# Patient Record
Sex: Female | Born: 1955 | Race: White | Hispanic: No | Marital: Married | Smoking: Former smoker
Health system: Southern US, Community
[De-identification: ages and names within clinical notes are randomized; demographics above are authoritative.]

## PROBLEM LIST (undated history)

## (undated) DIAGNOSIS — T7840XA Allergy, unspecified, initial encounter: Secondary | ICD-10-CM

## (undated) DIAGNOSIS — E079 Disorder of thyroid, unspecified: Secondary | ICD-10-CM

## (undated) DIAGNOSIS — E559 Vitamin D deficiency, unspecified: Secondary | ICD-10-CM

## (undated) DIAGNOSIS — F32A Depression, unspecified: Secondary | ICD-10-CM

## (undated) DIAGNOSIS — I10 Essential (primary) hypertension: Secondary | ICD-10-CM

## (undated) DIAGNOSIS — F419 Anxiety disorder, unspecified: Secondary | ICD-10-CM

## (undated) DIAGNOSIS — F329 Major depressive disorder, single episode, unspecified: Secondary | ICD-10-CM

## (undated) DIAGNOSIS — J45909 Unspecified asthma, uncomplicated: Secondary | ICD-10-CM

## (undated) HISTORY — DX: Essential (primary) hypertension: I10

## (undated) HISTORY — DX: Allergy, unspecified, initial encounter: T78.40XA

## (undated) HISTORY — DX: Major depressive disorder, single episode, unspecified: F32.9

## (undated) HISTORY — PX: SIGMOIDOSCOPY: SUR1295

## (undated) HISTORY — PX: TOTAL HIP ARTHROPLASTY: SHX124

## (undated) HISTORY — PX: BACK SURGERY: SHX140

## (undated) HISTORY — PX: CHOLECYSTECTOMY: SHX55

## (undated) HISTORY — PX: CATARACT EXTRACTION W/ INTRAOCULAR LENS IMPLANT: SHX1309

## (undated) HISTORY — DX: Anxiety disorder, unspecified: F41.9

## (undated) HISTORY — PX: TUBAL LIGATION: SHX77

## (undated) HISTORY — DX: Depression, unspecified: F32.A

## (undated) HISTORY — DX: Vitamin D deficiency, unspecified: E55.9

## (undated) HISTORY — DX: Unspecified asthma, uncomplicated: J45.909

## (undated) HISTORY — PX: HEMORRHOID SURGERY: SHX153

---

## 1997-08-14 ENCOUNTER — Ambulatory Visit (HOSPITAL_COMMUNITY): Admission: RE | Admit: 1997-08-14 | Discharge: 1997-08-14 | Payer: Self-pay | Admitting: Internal Medicine

## 1997-12-11 ENCOUNTER — Ambulatory Visit: Admission: RE | Admit: 1997-12-11 | Discharge: 1997-12-11 | Payer: Self-pay | Admitting: Internal Medicine

## 1998-05-28 ENCOUNTER — Ambulatory Visit (HOSPITAL_COMMUNITY): Admission: RE | Admit: 1998-05-28 | Discharge: 1998-05-28 | Payer: Self-pay | Admitting: Gastroenterology

## 1998-07-08 ENCOUNTER — Ambulatory Visit (HOSPITAL_COMMUNITY): Admission: RE | Admit: 1998-07-08 | Discharge: 1998-07-08 | Payer: Self-pay | Admitting: Gastroenterology

## 1999-07-15 ENCOUNTER — Ambulatory Visit (HOSPITAL_COMMUNITY): Admission: RE | Admit: 1999-07-15 | Discharge: 1999-07-15 | Payer: Self-pay | Admitting: Internal Medicine

## 1999-07-15 ENCOUNTER — Encounter: Payer: Self-pay | Admitting: Internal Medicine

## 1999-09-10 ENCOUNTER — Other Ambulatory Visit: Admission: RE | Admit: 1999-09-10 | Discharge: 1999-09-10 | Payer: Self-pay | Admitting: Obstetrics and Gynecology

## 2000-08-12 ENCOUNTER — Ambulatory Visit (HOSPITAL_COMMUNITY): Admission: RE | Admit: 2000-08-12 | Discharge: 2000-08-12 | Payer: Self-pay | Admitting: Obstetrics and Gynecology

## 2000-08-12 ENCOUNTER — Encounter: Payer: Self-pay | Admitting: Obstetrics and Gynecology

## 2000-11-08 ENCOUNTER — Encounter: Payer: Self-pay | Admitting: Internal Medicine

## 2000-11-08 ENCOUNTER — Ambulatory Visit (HOSPITAL_COMMUNITY): Admission: RE | Admit: 2000-11-08 | Discharge: 2000-11-08 | Payer: Self-pay | Admitting: Internal Medicine

## 2001-04-04 ENCOUNTER — Ambulatory Visit (HOSPITAL_COMMUNITY): Admission: RE | Admit: 2001-04-04 | Discharge: 2001-04-04 | Payer: Self-pay | Admitting: Gastroenterology

## 2003-02-21 ENCOUNTER — Ambulatory Visit (HOSPITAL_COMMUNITY): Admission: RE | Admit: 2003-02-21 | Discharge: 2003-02-21 | Payer: Self-pay | Admitting: Obstetrics and Gynecology

## 2003-02-21 ENCOUNTER — Encounter: Payer: Self-pay | Admitting: Obstetrics and Gynecology

## 2003-03-31 ENCOUNTER — Emergency Department (HOSPITAL_COMMUNITY): Admission: EM | Admit: 2003-03-31 | Discharge: 2003-03-31 | Payer: Self-pay | Admitting: Emergency Medicine

## 2004-07-28 ENCOUNTER — Ambulatory Visit: Payer: Self-pay | Admitting: *Deleted

## 2004-08-24 ENCOUNTER — Other Ambulatory Visit: Admission: RE | Admit: 2004-08-24 | Discharge: 2004-08-24 | Payer: Self-pay | Admitting: Obstetrics and Gynecology

## 2004-11-06 ENCOUNTER — Ambulatory Visit: Payer: Self-pay | Admitting: Pulmonary Disease

## 2004-11-18 ENCOUNTER — Ambulatory Visit: Payer: Self-pay

## 2010-09-25 ENCOUNTER — Encounter: Payer: Self-pay | Admitting: Gastroenterology

## 2010-09-25 ENCOUNTER — Ambulatory Visit (AMBULATORY_SURGERY_CENTER): Payer: BC Managed Care – PPO | Admitting: *Deleted

## 2010-09-25 VITALS — Ht 59.5 in | Wt 132.0 lb

## 2010-09-25 DIAGNOSIS — Z1211 Encounter for screening for malignant neoplasm of colon: Secondary | ICD-10-CM

## 2010-09-25 MED ORDER — PEG-KCL-NACL-NASULF-NA ASC-C 100 G PO SOLR: ORAL | Status: DC

## 2010-10-16 ENCOUNTER — Encounter: Payer: Self-pay | Admitting: Gastroenterology

## 2010-10-16 ENCOUNTER — Ambulatory Visit (AMBULATORY_SURGERY_CENTER): Payer: BC Managed Care – PPO | Admitting: Gastroenterology

## 2010-10-16 VITALS — BP 132/91 | HR 75 | Temp 98.0°F | Resp 20 | Ht 59.5 in | Wt 132.0 lb

## 2010-10-16 DIAGNOSIS — K648 Other hemorrhoids: Secondary | ICD-10-CM

## 2010-10-16 DIAGNOSIS — Z1211 Encounter for screening for malignant neoplasm of colon: Secondary | ICD-10-CM

## 2010-10-16 HISTORY — PX: COLONOSCOPY: SHX174

## 2010-10-16 MED ORDER — SODIUM CHLORIDE 0.9 % IV SOLN: 500.0000 mL | INTRAVENOUS | Status: DC

## 2010-10-16 NOTE — Patient Instructions (Signed)
Please refer to blue and green discharge instruction sheets. 

## 2010-10-19 ENCOUNTER — Telehealth: Payer: Self-pay

## 2010-10-19 NOTE — Telephone Encounter (Signed)
No answer/no ID anwswering machine.

## 2010-10-19 NOTE — Telephone Encounter (Signed)
No ID on answering machine. 

## 2011-11-19 ENCOUNTER — Ambulatory Visit (HOSPITAL_COMMUNITY)
Admission: RE | Admit: 2011-11-19 | Discharge: 2011-11-19 | Disposition: A | Discharge status: Home / Self Care | Payer: BC Managed Care – PPO | Source: Ambulatory Visit | Attending: Internal Medicine | Admitting: Internal Medicine

## 2011-11-19 ENCOUNTER — Other Ambulatory Visit (HOSPITAL_COMMUNITY): Payer: Self-pay | Admitting: Internal Medicine

## 2011-11-19 DIAGNOSIS — I10 Essential (primary) hypertension: Secondary | ICD-10-CM

## 2011-11-19 DIAGNOSIS — F17200 Nicotine dependence, unspecified, uncomplicated: Secondary | ICD-10-CM

## 2011-11-19 DIAGNOSIS — G510 Bell's palsy: Secondary | ICD-10-CM | POA: Insufficient documentation

## 2011-12-01 ENCOUNTER — Other Ambulatory Visit: Payer: Self-pay | Admitting: Internal Medicine

## 2011-12-01 DIAGNOSIS — G510 Bell's palsy: Secondary | ICD-10-CM

## 2011-12-01 DIAGNOSIS — R519 Headache, unspecified: Secondary | ICD-10-CM

## 2011-12-01 DIAGNOSIS — G8929 Other chronic pain: Secondary | ICD-10-CM

## 2011-12-03 ENCOUNTER — Ambulatory Visit
Admission: RE | Admit: 2011-12-03 | Discharge: 2011-12-03 | Disposition: A | Discharge status: Home / Self Care | Payer: BC Managed Care – PPO | Source: Ambulatory Visit | Attending: Internal Medicine | Admitting: Internal Medicine

## 2011-12-03 DIAGNOSIS — G510 Bell's palsy: Secondary | ICD-10-CM

## 2011-12-03 DIAGNOSIS — R519 Headache, unspecified: Secondary | ICD-10-CM

## 2011-12-03 DIAGNOSIS — G8929 Other chronic pain: Secondary | ICD-10-CM

## 2011-12-03 MED ORDER — GADOBENATE DIMEGLUMINE 529 MG/ML IV SOLN
12.0000 mL | Freq: Once | INTRAVENOUS | Status: AC | PRN
  Administered 2011-12-03: 12 mL via INTRAVENOUS

## 2013-05-29 DIAGNOSIS — T7840XA Allergy, unspecified, initial encounter: Secondary | ICD-10-CM | POA: Insufficient documentation

## 2013-05-29 DIAGNOSIS — F419 Anxiety disorder, unspecified: Secondary | ICD-10-CM | POA: Insufficient documentation

## 2013-05-29 DIAGNOSIS — F329 Major depressive disorder, single episode, unspecified: Secondary | ICD-10-CM | POA: Insufficient documentation

## 2013-05-29 DIAGNOSIS — F32A Depression, unspecified: Secondary | ICD-10-CM | POA: Insufficient documentation

## 2013-05-29 DIAGNOSIS — I10 Essential (primary) hypertension: Secondary | ICD-10-CM | POA: Insufficient documentation

## 2013-05-30 ENCOUNTER — Encounter: Payer: Self-pay | Admitting: Internal Medicine

## 2013-05-30 ENCOUNTER — Ambulatory Visit (INDEPENDENT_AMBULATORY_CARE_PROVIDER_SITE_OTHER): Payer: BC Managed Care – PPO | Admitting: Internal Medicine

## 2013-05-30 VITALS — BP 126/88 | HR 76 | Temp 97.9°F | Resp 16 | Wt 142.4 lb

## 2013-05-30 DIAGNOSIS — E559 Vitamin D deficiency, unspecified: Secondary | ICD-10-CM | POA: Insufficient documentation

## 2013-05-30 DIAGNOSIS — I10 Essential (primary) hypertension: Secondary | ICD-10-CM

## 2013-05-30 DIAGNOSIS — E782 Mixed hyperlipidemia: Secondary | ICD-10-CM | POA: Insufficient documentation

## 2013-05-30 DIAGNOSIS — R7309 Other abnormal glucose: Secondary | ICD-10-CM | POA: Insufficient documentation

## 2013-05-30 DIAGNOSIS — Z79899 Other long term (current) drug therapy: Secondary | ICD-10-CM

## 2013-05-30 LAB — HEMOGLOBIN A1C
HEMOGLOBIN A1C: 5.2 % (ref <5.7)
MEAN PLASMA GLUCOSE: 103 mg/dL (ref <117)

## 2013-05-30 MED ORDER — MELOXICAM 15 MG PO TABS: 15.0000 mg | ORAL_TABLET | Freq: Every day | ORAL | Status: DC

## 2013-05-30 NOTE — Progress Notes (Signed)
Patient ID: Shirley Johnson, female   DOB: March 11, 1956, 58 y.o.   MRN: [id number]   This very nice 58 y.o. MWF presents for 3 month follow up with Hypertension, Hyperlipidemia, Pre-Diabetes and Vitamin D Deficiency.    HTN predates since 2007. She quit smoking about 4 months ago when she realized the effect of nicotine on raising her BP. BP has been controlled at home. Today's BP: 126/88 mmHg . Patient denies any cardiac type chest pain, palpitations, dyspnea/orthopnea/PND, dizziness, claudication, or dependent edema.   Hyperlipidemia and elevated Triglycerides is controlled with diet. Last Cholesterol was  152, Triglycerides were 124, HDL47  and LDL 80 in [id number]. Patient denies myalgias or other med SE's.    Also, the patient is screened for PreDiabetes/insulin resistance with last A1c of 5.1% in July 2014. Patient denies any symptoms of reactive hypoglycemia, diabetic polys, paresthesias or visual blurring.   She is seeing Dr Ellene Route for limiting neck and back pain and is using Anaprox and/or Lodine for stiffness and pain . She says robaxin helps a lot with the stiffness. She has no radiculitis.   Further, Patient has history of Vitamin D Deficiency with last vitamin D of 78 in July. Patient supplements vitamin D without any suspected side-effects.    Medication List       This list is accurate as of: 05/30/13  9:33 AM.  Always use your most recent med list.               acetaminophen 500 MG tablet  Commonly known as:  TYLENOL  Take 1,000 mg by mouth every 6 (six) hours as needed.     ALPRAZolam 1 MG tablet  Commonly known as:  XANAX  Take 1 mg by mouth at bedtime.     etodolac 200 MG capsule  Commonly known as:  LODINE  Take 200 mg by mouth 2 (two) times daily. Takes prn for back pain     MAXZIDE-25 37.5-25 MG per tablet  Generic drug:  triamterene-hydrochlorothiazide  Take 1 tablet by mouth daily.     meloxicam 15 MG tablet  Commonly known as:  MOBIC  Take 1 tablet (15 mg  total) by mouth daily. Wit food for pain & inflammation     methocarbamol 500 MG tablet  Commonly known as:  ROBAXIN  Take 500 mg by mouth 2 (two) times daily.     naproxen sodium 220 MG tablet  Commonly known as:  ANAPROX  Take 440 mg by mouth as needed. pain     SENOKOT PO  Take by mouth. Takes 2 at bedtime     sertraline 50 MG tablet  Commonly known as:  ZOLOFT  Take 50 mg by mouth at bedtime.     Vitamin D 2000 UNITS tablet  Take 2,000 Units by mouth daily. Takes 4000 units daily         Allergies  Allergen Reactions  . Doxycycline Nausea And Vomiting  . Sulfa Antibiotics Nausea And Vomiting  . Vicodin [Hydrocodone-Acetaminophen] Other (See Comments)    Horrible nightmares  . Erythromycin Rash  . Penicillins Rash  . Tetracyclines & Related Rash    PMHx:   Past Medical History  Diagnosis Date  . Allergy     pollen  . Depression   . Hypertension   . Vitamin D deficiency   . Anxiety     FHx:    Reviewed / unchanged  SHx:    Reviewed / unchanged  Systems Review: Constitutional: Denies fever,  chills, wt changes, headaches, insomnia, fatigue, night sweats, change in appetite. Eyes: Denies redness, blurred vision, diplopia, discharge, itchy, watery eyes.  ENT: Denies discharge, congestion, post nasal drip, epistaxis, sore throat, earache, hearing loss, dental pain, tinnitus, vertigo, sinus pain, snoring.  CV: Denies chest pain, palpitations, irregular heartbeat, syncope, dyspnea, diaphoresis, orthopnea, PND, claudication, edema. Respiratory: denies cough, dyspnea, DOE, pleurisy, hoarseness, laryngitis, wheezing.  Gastrointestinal: Denies dysphagia, odynophagia, heartburn, reflux, water brash, abdominal pain or cramps, nausea, vomiting, bloating, diarrhea, constipation, hematemesis, melena, hematochezia,  or hemorrhoids. Genitourinary: Denies dysuria, frequency, urgency, nocturia, hesitancy, discharge, hematuria, flank pain. Musculoskeletal: Has neck/back pains as  above Skin: Denies pruritus, rash, hives, warts, acne, eczema, change in skin lesion(s). Neuro: No weakness, tremor, incoordination, spasms, paresthesia, or pain. Psychiatric: Denies confusion, memory loss, or sensory loss. Endo: Denies change in weight, skin, hair change.  Heme/Lymph: No excessive bleeding, bruising, orenlarged lymph nodes.  Filed Vitals:   05/30/13 0902  BP: 126/88  Pulse: 76  Temp: 97.9 F (36.6 C)  Resp: 16    Estimated body mass index is 28.29 kg/(m^2) as calculated from the following:   Height as of 10/16/10: 4' 11.5" (1.511 m).   Weight as of this encounter: 142 lb 6.4 oz (64.592 kg).  On Exam: Appears well nourished - in no distress. Eyes: PERRLA, EOMs, conjunctiva no swelling or erythema. Sinuses: No frontal/maxillary tenderness ENT/Mouth: EAC's clear, TM's nl w/o erythema, bulging. Nares clear w/o erythema, swelling, exudates. Oropharynx clear without erythema or exudates. Oral hygiene is good. Tongue normal, non obstructing. Hearing intact.  Neck: Supple. Thyroid nl. Car 2+/2+ without bruits, nodes or JVD. Chest: Respirations nl with BS clear & equal w/o rales, rhonchi, wheezing or stridor.  Cor: Heart sounds normal w/ regular rate and rhythm without sig. murmurs, gallops, clicks, or rubs. Peripheral pulses normal and equal  without edema.  Abdomen: Soft & bowel sounds normal. Non-tender w/o guarding, rebound, hernias, masses, or organomegaly.  Lymphatics: Unremarkable.  Musculoskeletal: Full ROM all peripheral extremities, joint stability, 5/5 strength, and normal gait.  Skin: Warm, dry without exposed rashes, lesions, ecchymosis apparent.  Neuro: Cranial nerves intact, reflexes equal bilaterally. Sensory-motor testing grossly intact. Tendon reflexes grossly intact.  Pysch: Alert & oriented x 3. Insight and judgement nl & appropriate. No ideations.  Assessment and Plan:  1. Hypertension - Continue monitor blood pressure at home. Continue diet/meds  same.  2. Hyperlipidemia - Continue diet, exercise,& lifestyle modifications. Continue monitor periodic cholesterol/liver & renal functions   3. Pre-diabetes - Continue screening & diet, exercise, lifestyle modifications. Monitor appropriate labs.  4. Vitamin D Deficiency - Continue supplementation.  5. Chronic Neck/Low Back Pain due to DJD/DDD - discussed trying Meloxicam in lieu of anaprox/lodine  Recommended regular exercise, BP monitoring, weight control, and discussed med and SE's. Recommended labs to assess and monitor clinical status. Further disposition pending results of labs.

## 2013-05-30 NOTE — Patient Instructions (Signed)

## 2013-05-31 LAB — MAGNESIUM: Magnesium: 1.7 mg/dL (ref 1.5–2.5)

## 2013-05-31 LAB — HEPATIC FUNCTION PANEL
ALBUMIN: 4.8 g/dL (ref 3.5–5.2)
ALK PHOS: 53 U/L (ref 39–117)
ALT: 18 U/L (ref 0–35)
AST: 20 U/L (ref 0–37)
BILIRUBIN DIRECT: 0.2 mg/dL (ref 0.0–0.3)
BILIRUBIN INDIRECT: 0.5 mg/dL (ref 0.0–0.9)
BILIRUBIN TOTAL: 0.7 mg/dL (ref 0.3–1.2)
Total Protein: 6.9 g/dL (ref 6.0–8.3)

## 2013-05-31 LAB — LIPID PANEL
CHOLESTEROL: 166 mg/dL (ref 0–200)
HDL: 42 mg/dL (ref >39)
LDL Cholesterol: 96 mg/dL (ref 0–99)
Total CHOL/HDL Ratio: 4 Ratio
Triglycerides: 138 mg/dL (ref <150)
VLDL: 28 mg/dL (ref 0–40)

## 2013-05-31 LAB — BASIC METABOLIC PANEL WITH GFR
BUN: 18 mg/dL (ref 6–23)
CHLORIDE: 100 meq/L (ref 96–112)
CO2: 32 mEq/L (ref 19–32)
CREATININE: 0.72 mg/dL (ref 0.50–1.10)
Calcium: 10.2 mg/dL (ref 8.4–10.5)
GFR, Est African American: >89 mL/min
Glucose, Bld: 93 mg/dL (ref 70–99)
POTASSIUM: 3.5 meq/L (ref 3.5–5.3)
SODIUM: 140 meq/L (ref 135–145)

## 2013-05-31 LAB — INSULIN, FASTING: INSULIN FASTING, SERUM: 11 u[IU]/mL (ref 3–28)

## 2013-05-31 LAB — CBC WITH DIFFERENTIAL/PLATELET
BASOS ABS: 0 10*3/uL (ref 0.0–0.1)
Basophils Relative: 1 % (ref 0–1)
Eosinophils Absolute: 0.2 10*3/uL (ref 0.0–0.7)
Eosinophils Relative: 3 % (ref 0–5)
HCT: 44.1 % (ref 36.0–46.0)
Hemoglobin: 15 g/dL (ref 12.0–15.0)
LYMPHS ABS: 1.9 10*3/uL (ref 0.7–4.0)
LYMPHS PCT: 33 % (ref 12–46)
MCH: 30.3 pg (ref 26.0–34.0)
MCHC: 34 g/dL (ref 30.0–36.0)
MCV: 89.1 fL (ref 78.0–100.0)
Monocytes Absolute: 0.4 10*3/uL (ref 0.1–1.0)
Monocytes Relative: 6 % (ref 3–12)
NEUTROS ABS: 3.4 10*3/uL (ref 1.7–7.7)
NEUTROS PCT: 57 % (ref 43–77)
PLATELETS: 251 10*3/uL (ref 150–400)
RBC: 4.95 MIL/uL (ref 3.87–5.11)
RDW: 12.9 % (ref 11.5–15.5)
WBC: 5.9 10*3/uL (ref 4.0–10.5)

## 2013-05-31 LAB — VITAMIN D 25 HYDROXY (VIT D DEFICIENCY, FRACTURES): Vit D, 25-Hydroxy: 94 ng/mL — ABNORMAL HIGH (ref 30–89)

## 2013-05-31 LAB — TSH: TSH: 3.331 u[IU]/mL (ref 0.350–4.500)

## 2013-08-14 ENCOUNTER — Other Ambulatory Visit: Payer: Self-pay | Admitting: Internal Medicine

## 2013-08-14 ENCOUNTER — Other Ambulatory Visit: Payer: Self-pay | Admitting: *Deleted

## 2013-08-14 MED ORDER — PREDNISONE 10 MG PO TABS: ORAL_TABLET | ORAL | Status: DC

## 2013-08-14 NOTE — Telephone Encounter (Signed)
Patient called and asked for med forn poison ivy rash.  RX Prednisone called to [facility] [address] per Dr Melford Aase.

## 2013-08-27 ENCOUNTER — Other Ambulatory Visit: Payer: Self-pay | Admitting: Internal Medicine

## 2013-09-07 ENCOUNTER — Encounter: Payer: Self-pay | Admitting: Physician Assistant

## 2013-09-07 ENCOUNTER — Ambulatory Visit (INDEPENDENT_AMBULATORY_CARE_PROVIDER_SITE_OTHER): Payer: BC Managed Care – PPO | Admitting: Physician Assistant

## 2013-09-07 VITALS — BP 132/80 | HR 76 | Temp 97.9°F | Resp 16 | Wt 146.0 lb

## 2013-09-07 DIAGNOSIS — E559 Vitamin D deficiency, unspecified: Secondary | ICD-10-CM

## 2013-09-07 DIAGNOSIS — R7309 Other abnormal glucose: Secondary | ICD-10-CM

## 2013-09-07 DIAGNOSIS — I10 Essential (primary) hypertension: Secondary | ICD-10-CM

## 2013-09-07 DIAGNOSIS — Z79899 Other long term (current) drug therapy: Secondary | ICD-10-CM

## 2013-09-07 DIAGNOSIS — E782 Mixed hyperlipidemia: Secondary | ICD-10-CM

## 2013-09-07 LAB — LIPID PANEL
Cholesterol: 161 mg/dL (ref 0–200)
HDL: 38 mg/dL — ABNORMAL LOW (ref >39)
LDL CALC: 88 mg/dL (ref 0–99)
Total CHOL/HDL Ratio: 4.2 Ratio
Triglycerides: 174 mg/dL — ABNORMAL HIGH (ref <150)
VLDL: 35 mg/dL (ref 0–40)

## 2013-09-07 LAB — BASIC METABOLIC PANEL WITH GFR
BUN: 21 mg/dL (ref 6–23)
CO2: 31 meq/L (ref 19–32)
Calcium: 10.3 mg/dL (ref 8.4–10.5)
Chloride: 101 mEq/L (ref 96–112)
Creat: 0.7 mg/dL (ref 0.50–1.10)
GFR, Est African American: >89 mL/min
GFR, Est Non African American: >89 mL/min
GLUCOSE: 89 mg/dL (ref 70–99)
POTASSIUM: 3.4 meq/L — AB (ref 3.5–5.3)
SODIUM: 140 meq/L (ref 135–145)

## 2013-09-07 LAB — CBC WITH DIFFERENTIAL/PLATELET
Basophils Absolute: 0.1 10*3/uL (ref 0.0–0.1)
Basophils Relative: 1 % (ref 0–1)
Eosinophils Absolute: 0.3 10*3/uL (ref 0.0–0.7)
Eosinophils Relative: 5 % (ref 0–5)
HCT: 40.7 % (ref 36.0–46.0)
HEMOGLOBIN: 14.1 g/dL (ref 12.0–15.0)
LYMPHS ABS: 1.7 10*3/uL (ref 0.7–4.0)
Lymphocytes Relative: 30 % (ref 12–46)
MCH: 29 pg (ref 26.0–34.0)
MCHC: 34.6 g/dL (ref 30.0–36.0)
MCV: 83.6 fL (ref 78.0–100.0)
MONOS PCT: 7 % (ref 3–12)
Monocytes Absolute: 0.4 10*3/uL (ref 0.1–1.0)
NEUTROS ABS: 3.2 10*3/uL (ref 1.7–7.7)
NEUTROS PCT: 57 % (ref 43–77)
Platelets: 250 10*3/uL (ref 150–400)
RBC: 4.87 MIL/uL (ref 3.87–5.11)
RDW: 13.8 % (ref 11.5–15.5)
WBC: 5.7 10*3/uL (ref 4.0–10.5)

## 2013-09-07 LAB — HEPATIC FUNCTION PANEL
ALT: 32 U/L (ref 0–35)
AST: 29 U/L (ref 0–37)
Albumin: 4.7 g/dL (ref 3.5–5.2)
Alkaline Phosphatase: 61 U/L (ref 39–117)
BILIRUBIN DIRECT: 0.1 mg/dL (ref 0.0–0.3)
BILIRUBIN INDIRECT: 0.4 mg/dL (ref 0.2–1.2)
Total Bilirubin: 0.5 mg/dL (ref 0.2–1.2)
Total Protein: 7.1 g/dL (ref 6.0–8.3)

## 2013-09-07 LAB — MAGNESIUM: MAGNESIUM: 1.7 mg/dL (ref 1.5–2.5)

## 2013-09-07 LAB — TSH: TSH: 5.403 u[IU]/mL — ABNORMAL HIGH (ref 0.350–4.500)

## 2013-09-07 NOTE — Progress Notes (Signed)
HPI 58 y.o. female  presents for 3 month follow up with hypertension, hyperlipidemia, prediabetes and vitamin D. Her blood pressure has been controlled at home, today their BP is BP: 132/80 mmHg She does not workout. She denies chest pain, shortness of breath, dizziness.  She is not on cholesterol medication and denies myalgias. Her cholesterol is at goal. The cholesterol last visit was:   Lab Results  Component Value Date   CHOL 166 05/30/2013   HDL 42 05/30/2013   LDLCALC 96 05/30/2013   TRIG 138 05/30/2013   CHOLHDL 4.0 05/30/2013   Last A1C in the office was:  Lab Results  Component Value Date   HGBA1C 5.2 05/30/2013   Patient is on Vitamin D supplement.   She is have hysterectomy, rectocele repair and bladder tack on July 7th at [facility] hospital with Dr. Tressia Danas in hopes of trying to relieve some of her back spasms.  She recently had a medroldose pak for poison ivy which is resolved.  Her allergies have been very bad, she went to urgent care, took antihistamines and nose sprays. She started taking OTC bovine thyroid and states it is better.   Current Medications:  Current Outpatient Prescriptions on File Prior to Visit  Medication Sig Dispense Refill  . acetaminophen (TYLENOL) 500 MG tablet Take 1,000 mg by mouth every 6 (six) hours as needed.        . Cholecalciferol (VITAMIN D) 2000 UNITS tablet Take 2,000 Units by mouth daily. Takes 4000 units daily      . etodolac (LODINE) 200 MG capsule Take 200 mg by mouth 2 (two) times daily. Takes prn for back pain      . meloxicam (MOBIC) 15 MG tablet Take 1 tablet (15 mg total) by mouth daily. Wit food for pain & inflammation  90 tablet  99  . methocarbamol (ROBAXIN) 500 MG tablet Take 500 mg by mouth 2 (two) times daily.      . naproxen sodium (ANAPROX) 220 MG tablet Take 440 mg by mouth as needed. pain       . Sennosides (SENOKOT PO) Take by mouth. Takes 2 at bedtime      . sertraline (ZOLOFT) 50 MG tablet Take 50 mg by mouth at bedtime.         . triamterene-hydrochlorothiazide (MAXZIDE) 75-50 MG per tablet TAKE 1 TABLET EVERY MORNING FOR BP AND FLUID  90 tablet  1   Current Facility-Administered Medications on File Prior to Visit  Medication Dose Route Frequency Provider Last Rate Last Dose  . 0.9 %  sodium chloride infusion  500 mL Intravenous Continuous Inda Castle, MD        Medical History:  Past Medical History  Diagnosis Date  . Allergy     pollen  . Depression   . Hypertension   . Vitamin D deficiency   . Anxiety    Allergies:  Allergies  Allergen Reactions  . Doxycycline Nausea And Vomiting  . Sulfa Antibiotics Nausea And Vomiting  . Vicodin [Hydrocodone-Acetaminophen] Other (See Comments)    Horrible nightmares  . Erythromycin Rash  . Penicillins Rash  . Tetracyclines & Related Rash     Review of Systems: [X]  = complains of  [ ]  = denies  General: Fatigue [ ]  Fever [ ]  Chills [ ]  Weakness [ ]   Insomnia [ ]  Eyes: Redness [ ]  Blurred vision [ ]  Diplopia [ ]   ENT: Congestion [ ]  Sinus Pain [ ]  Post Nasal Drip [ ]  Sore  Throat [ ]  Earache [ ]   Cardiac: Chest pain/pressure [ ]  SOB [ ]  Orthopnea [ ]   Palpitations [ ]   Paroxysmal nocturnal dyspnea[ ]  Claudication [ ]  Edema [ ]   Pulmonary: Cough [ ]  Wheezing[ ]   SOB [ ]   Snoring [ ]   GI: Nausea [ ]  Vomiting[ ]  Dysphagia[ ]  Heartburn[ ]  Abdominal pain [ ]  Constipation [ ] ; Diarrhea [ ] ; BRBPR [ ]  Melena[ ]  GU: Hematuria[ ]  Dysuria [ ]  Nocturia[ ]  Urgency [ ]   Hesitancy [ ]  Discharge [ ]  Neuro: Headaches[ ]  Vertigo[ ]  Paresthesias[ ]  Spasm [ ]  Speech changes [ ]  Incoordination [ ]   Ortho: Arthritis [ ]  Joint pain [ ]  Muscle pain [ ]  Joint swelling [ ]  Back Pain Valu.Nieves ] Skin:  Rash [ ]   Pruritis [ ]  Change in skin lesion [ ]   Psych: Depression[ ]  Anxiety[ ]  Confusion [ ]  Memory loss [ ]   Heme/Lypmh: Bleeding [ ]  Bruising [ ]  Enlarged lymph nodes [ ]   Endocrine: Visual blurring [ ]  Paresthesia [ ]  Polyuria [ ]  Polydypsea [ ]    Heat/cold intolerance [ ]   Hypoglycemia [ ]   Family history- Review and unchanged Social history- Review and unchanged Physical Exam: BP 132/80  Pulse 76  Temp(Src) 97.9 F (36.6 C)  Resp 16  Wt 146 lb (66.225 kg)  LMP 09/25/2010 Wt Readings from Last 3 Encounters:  09/07/13 146 lb (66.225 kg)  05/30/13 142 lb 6.4 oz (64.592 kg)  10/16/10 132 lb (59.875 kg)   General Appearance: Well nourished, in no apparent distress. Eyes: PERRLA, EOMs, conjunctiva no swelling or erythema Sinuses: No Frontal/maxillary tenderness ENT/Mouth: Ext aud canals clear, TMs without erythema, bulging. No erythema, swelling, or exudate on post pharynx.  Tonsils not swollen or erythematous. Hearing normal.  Neck: Supple, thyroid normal.  Respiratory: Respiratory effort normal, BS equal bilaterally without rales, rhonchi, wheezing or stridor.  Cardio: RRR with no MRGs. Brisk peripheral pulses without edema.  Abdomen: Soft, + BS.  Non tender, no guarding, rebound, hernias, masses. Lymphatics: Non tender without lymphadenopathy.  Musculoskeletal: Full ROM, 5/5 strength, normal gait.  Skin: Warm, dry without rashes, lesions, ecchymosis.  Neuro: Cranial nerves intact. Normal muscle tone, no cerebellar symptoms. Sensation intact.  Psych: Awake and oriented X 3, normal affect, Insight and Judgment appropriate.   Assessment and Plan:  Hypertension: Continue medication, monitor blood pressure at home. Continue DASH diet. Cholesterol: Continue diet and exercise. Check cholesterol.  Vitamin D Def- check level and continue medications.   Continue diet and meds as discussed. Further disposition pending results of labs.  Vicie Mutters 8:37 AM

## 2013-09-07 NOTE — Patient Instructions (Signed)

## 2013-09-08 LAB — VITAMIN D 25 HYDROXY (VIT D DEFICIENCY, FRACTURES): VIT D 25 HYDROXY: 84 ng/mL (ref 30–89)

## 2013-09-10 ENCOUNTER — Other Ambulatory Visit: Payer: Self-pay

## 2013-09-10 MED ORDER — POTASSIUM CHLORIDE CRYS ER 20 MEQ PO TBCR: 20.0000 meq | EXTENDED_RELEASE_TABLET | Freq: Once | ORAL | Status: DC

## 2013-09-10 MED ORDER — LEVOTHYROXINE SODIUM 75 MCG PO TABS: 75.0000 ug | ORAL_TABLET | Freq: Every day | ORAL | Status: DC

## 2013-09-11 ENCOUNTER — Other Ambulatory Visit: Payer: Self-pay | Admitting: Physician Assistant

## 2013-09-11 DIAGNOSIS — E041 Nontoxic single thyroid nodule: Secondary | ICD-10-CM

## 2013-09-11 DIAGNOSIS — R490 Dysphonia: Secondary | ICD-10-CM

## 2013-09-11 DIAGNOSIS — R1310 Dysphagia, unspecified: Secondary | ICD-10-CM

## 2013-09-14 ENCOUNTER — Ambulatory Visit
Admission: RE | Admit: 2013-09-14 | Discharge: 2013-09-14 | Disposition: A | Discharge status: Home / Self Care | Payer: BC Managed Care – PPO | Source: Ambulatory Visit | Attending: Physician Assistant | Admitting: Physician Assistant

## 2013-09-14 DIAGNOSIS — E041 Nontoxic single thyroid nodule: Secondary | ICD-10-CM

## 2013-10-09 ENCOUNTER — Other Ambulatory Visit: Payer: Self-pay | Admitting: Internal Medicine

## 2013-10-16 ENCOUNTER — Other Ambulatory Visit (INDEPENDENT_AMBULATORY_CARE_PROVIDER_SITE_OTHER): Payer: BC Managed Care – PPO

## 2013-10-16 DIAGNOSIS — E876 Hypokalemia: Secondary | ICD-10-CM

## 2013-10-16 DIAGNOSIS — E039 Hypothyroidism, unspecified: Secondary | ICD-10-CM

## 2013-10-16 LAB — BASIC METABOLIC PANEL WITH GFR
BUN: 23 mg/dL (ref 6–23)
CALCIUM: 9.8 mg/dL (ref 8.4–10.5)
CO2: 27 meq/L (ref 19–32)
CREATININE: 0.7 mg/dL (ref 0.50–1.10)
Chloride: 102 mEq/L (ref 96–112)
GFR, Est African American: >89 mL/min
Glucose, Bld: 91 mg/dL (ref 70–99)
Potassium: 3.7 mEq/L (ref 3.5–5.3)
Sodium: 139 mEq/L (ref 135–145)

## 2013-10-16 LAB — TSH: TSH: 0.529 u[IU]/mL (ref 0.350–4.500)

## 2013-10-24 ENCOUNTER — Other Ambulatory Visit: Payer: Self-pay

## 2013-10-24 NOTE — Telephone Encounter (Signed)
Received a paper note from front office staff, patient having problems accessing mychart, return call to patient , lmom with results and instructions regarding 10-16-13 labs

## 2013-10-31 ENCOUNTER — Encounter (HOSPITAL_COMMUNITY): Payer: Self-pay | Admitting: Pharmacist

## 2013-11-02 ENCOUNTER — Telehealth: Payer: Self-pay

## 2013-11-02 ENCOUNTER — Encounter (HOSPITAL_COMMUNITY)
Admission: RE | Admit: 2013-11-02 | Discharge: 2013-11-02 | Disposition: A | Discharge status: Home / Self Care | Payer: BC Managed Care – PPO | Source: Ambulatory Visit | Attending: Obstetrics and Gynecology | Admitting: Obstetrics and Gynecology

## 2013-11-02 ENCOUNTER — Encounter (HOSPITAL_COMMUNITY): Payer: Self-pay

## 2013-11-02 DIAGNOSIS — Z01812 Encounter for preprocedural laboratory examination: Secondary | ICD-10-CM | POA: Insufficient documentation

## 2013-11-02 DIAGNOSIS — Z01818 Encounter for other preprocedural examination: Secondary | ICD-10-CM | POA: Insufficient documentation

## 2013-11-02 LAB — BASIC METABOLIC PANEL
BUN: 21 mg/dL (ref 6–23)
CHLORIDE: 100 meq/L (ref 96–112)
CO2: 27 mEq/L (ref 19–32)
Calcium: 10 mg/dL (ref 8.4–10.5)
Creatinine, Ser: 0.66 mg/dL (ref 0.50–1.10)
GFR calc Af Amer: >90 mL/min (ref >90)
GLUCOSE: 105 mg/dL — AB (ref 70–99)
POTASSIUM: 3.3 meq/L — AB (ref 3.7–5.3)
SODIUM: 140 meq/L (ref 137–147)

## 2013-11-02 LAB — CBC
HCT: 40.8 % (ref 36.0–46.0)
HEMOGLOBIN: 14.5 g/dL (ref 12.0–15.0)
MCH: 30 pg (ref 26.0–34.0)
MCHC: 35.5 g/dL (ref 30.0–36.0)
MCV: 84.3 fL (ref 78.0–100.0)
Platelets: 266 10*3/uL (ref 150–400)
RBC: 4.84 MIL/uL (ref 3.87–5.11)
RDW: 12.9 % (ref 11.5–15.5)
WBC: 7.3 10*3/uL (ref 4.0–10.5)

## 2013-11-02 NOTE — Telephone Encounter (Signed)
Hassan Rowan from pre-op called requesting patient's most recent EKG. EKG was faxed to [facility] [phone]

## 2013-11-02 NOTE — Patient Instructions (Signed)
20 LLANA DESHAZO  11/02/2013   Your procedure is scheduled on:  11/13/13  Enter through the Main Entrance of [facility] [place] up the phone at the desk and dial [phone].   Call this number if you have problems the morning of surgery: [phone]   Remember:   Do not eat food:After Midnight.  Do not drink clear liquids: After Midnight.  Take these medicines the morning of surgery with A SIP OF WATER: Robaxin, Maxide and K-Dur. May take Thyroid Very early    Do not wear jewelry, make-up or nail polish.  Do not wear lotions, powders, or perfumes. You may wear deodorant.  Do not shave 48 hours prior to surgery.  Do not bring valuables to the hospital.  [facility] is not   responsible for any belongings or valuables brought to the hospital.  Contacts, dentures or bridgework may not be worn into surgery.  Leave suitcase in the car. After surgery it may be brought to your room.  For patients admitted to the hospital, checkout time is 11:00 AM the day of              discharge.   Patients discharged the day of surgery will not be allowed to drive             home.  Name and phone number of your driver: NA  Special Instructions:      Please read over the following fact sheets that you were given:   Surgical Site Infection Prevention

## 2013-11-11 NOTE — H&P (Signed)
Shirley Johnson is an 58 y.o. G 2 P 1 presents with symptomatic pelvic relaxation.  She feels a constant bulge in her vagina.  It is especially worse at the end of the day.  She is not having any stress urinary incontinence.  She is having problems with chronic constipation.    Pertinent Gynecological History: Menses: post-menopausal Bleeding: post menopausal bleeding Contraception: none DES exposure: denies Blood transfusions: none Sexually transmitted diseases: no past history Previous GYN Procedures: none  Last mammogram: normal Date: 2015 Last pap: normal Date: 2015  OB History: G2, P1  Menstrual History: Menarche age: unknown Patient's last menstrual period was 09/25/2010.    Past Medical History  Diagnosis Date  . Allergy     pollen  . Depression   . Hypertension   . Vitamin D deficiency   . Anxiety     Past Surgical History  Procedure Laterality Date  . Back surgery    . Tubal ligation    . Sigmoidoscopy    . Hemorrhoid surgery      banding    Family History  Problem Relation Age of Onset  . Hypertension Mother   . COPD Mother   . Alzheimer's disease Mother   . Heart disease Father   . Cancer Father     Prostate  . Thyroid disease Father     Social History:  reports that she quit smoking about 9 months ago. Her smoking use included Cigarettes. She smoked 0.80 packs per day. She has never used smokeless tobacco. She reports that she does not drink alcohol or use illicit drugs.  Allergies:  Allergies  Allergen Reactions  . Doxycycline Nausea And Vomiting  . Sulfa Antibiotics Nausea And Vomiting  . Vicodin [Hydrocodone-Acetaminophen] Other (See Comments)    Horrible nightmares  . Erythromycin Rash  . Penicillins Rash    Childhood rxn  . Tetracyclines & Related Rash    No prescriptions prior to admission    Review of Systems  All other systems reviewed and are negative.   Last menstrual period 09/25/2010. Physical Exam  Nursing note and  vitals reviewed. Constitutional: She appears well-developed.  HENT:  Head: Normocephalic.  Eyes: Pupils are equal, round, and reactive to light.  Neck: Normal range of motion.  Cardiovascular: Normal rate and regular rhythm.   Respiratory: Effort normal.  GI: Soft.  Genitourinary:  Grade 2 to 3 cervical uterine prolapse Shirlee Limerick 2 - 3 rectocele Minimal movement of the anterior vaginal wall No leakage of urine with cough    No results found for this or any previous visit (from the past 24 hour(s)).  No results found.  Assessment/Plan: Symptomatic Pelvic Relaxation LAVH BSO  Possible anterior repair Possible Possible repair Possible sacrospinous ligament fixation of vaginal cuff Risks reviewed with patient  Consent signed  Shirley Johnson L 11/11/2013, 10:12 PM

## 2013-11-12 MED ORDER — CIPROFLOXACIN IN D5W 400 MG/200ML IV SOLN
400.0000 mg | INTRAVENOUS | Status: AC
  Administered 2013-11-13: 400 mg via INTRAVENOUS
  Filled 2013-11-12: qty 200

## 2013-11-12 MED ORDER — METRONIDAZOLE IN NACL 5-0.79 MG/ML-% IV SOLN
500.0000 mg | INTRAVENOUS | Status: AC
  Administered 2013-11-13: 500 mg via INTRAVENOUS
  Filled 2013-11-12: qty 100

## 2013-11-13 ENCOUNTER — Encounter (HOSPITAL_COMMUNITY)
Admission: RE | Disposition: A | Discharge status: Home / Self Care | Payer: Self-pay | Source: Ambulatory Visit | Attending: Obstetrics and Gynecology

## 2013-11-13 ENCOUNTER — Observation Stay (HOSPITAL_COMMUNITY)
Admission: RE | Admit: 2013-11-13 | Discharge: 2013-11-14 | Disposition: A | Discharge status: Home / Self Care | Payer: BC Managed Care – PPO | Source: Ambulatory Visit | Attending: Obstetrics and Gynecology | Admitting: Obstetrics and Gynecology

## 2013-11-13 ENCOUNTER — Ambulatory Visit (HOSPITAL_COMMUNITY): Payer: BC Managed Care – PPO | Admitting: Anesthesiology

## 2013-11-13 ENCOUNTER — Encounter (HOSPITAL_COMMUNITY): Payer: BC Managed Care – PPO | Admitting: Anesthesiology

## 2013-11-13 ENCOUNTER — Encounter (HOSPITAL_COMMUNITY): Payer: Self-pay | Admitting: Anesthesiology

## 2013-11-13 DIAGNOSIS — D252 Subserosal leiomyoma of uterus: Secondary | ICD-10-CM | POA: Insufficient documentation

## 2013-11-13 DIAGNOSIS — E559 Vitamin D deficiency, unspecified: Secondary | ICD-10-CM | POA: Insufficient documentation

## 2013-11-13 DIAGNOSIS — K5900 Constipation, unspecified: Secondary | ICD-10-CM | POA: Insufficient documentation

## 2013-11-13 DIAGNOSIS — I10 Essential (primary) hypertension: Secondary | ICD-10-CM | POA: Insufficient documentation

## 2013-11-13 DIAGNOSIS — N80109 Endometriosis of ovary, unspecified side, unspecified depth: Secondary | ICD-10-CM | POA: Insufficient documentation

## 2013-11-13 DIAGNOSIS — Z90710 Acquired absence of both cervix and uterus: Secondary | ICD-10-CM | POA: Diagnosis present

## 2013-11-13 DIAGNOSIS — D251 Intramural leiomyoma of uterus: Secondary | ICD-10-CM | POA: Insufficient documentation

## 2013-11-13 DIAGNOSIS — F3289 Other specified depressive episodes: Secondary | ICD-10-CM | POA: Insufficient documentation

## 2013-11-13 DIAGNOSIS — F329 Major depressive disorder, single episode, unspecified: Secondary | ICD-10-CM | POA: Insufficient documentation

## 2013-11-13 DIAGNOSIS — E039 Hypothyroidism, unspecified: Secondary | ICD-10-CM | POA: Insufficient documentation

## 2013-11-13 DIAGNOSIS — N838 Other noninflammatory disorders of ovary, fallopian tube and broad ligament: Secondary | ICD-10-CM | POA: Insufficient documentation

## 2013-11-13 DIAGNOSIS — N812 Incomplete uterovaginal prolapse: Principal | ICD-10-CM | POA: Insufficient documentation

## 2013-11-13 DIAGNOSIS — N801 Endometriosis of ovary: Secondary | ICD-10-CM | POA: Insufficient documentation

## 2013-11-13 DIAGNOSIS — Z8249 Family history of ischemic heart disease and other diseases of the circulatory system: Secondary | ICD-10-CM | POA: Insufficient documentation

## 2013-11-13 DIAGNOSIS — Z87891 Personal history of nicotine dependence: Secondary | ICD-10-CM | POA: Insufficient documentation

## 2013-11-13 HISTORY — PX: LAPAROSCOPIC ASSISTED VAGINAL HYSTERECTOMY: SHX5398

## 2013-11-13 HISTORY — PX: ANTERIOR AND POSTERIOR REPAIR: SHX5121

## 2013-11-13 SURGERY — HYSTERECTOMY, VAGINAL, LAPAROSCOPY-ASSISTED
Anesthesia: General | Site: Vagina

## 2013-11-13 MED ORDER — DEXTROSE IN LACTATED RINGERS 5 % IV SOLN
INTRAVENOUS | Status: DC
  Administered 2013-11-13 (×2): via INTRAVENOUS

## 2013-11-13 MED ORDER — SODIUM CHLORIDE 0.9 % IJ SOLN: 9.0000 mL | INTRAMUSCULAR | Status: DC | PRN

## 2013-11-13 MED ORDER — ONDANSETRON HCL 4 MG/2ML IJ SOLN
4.0000 mg | Freq: Four times a day (QID) | INTRAMUSCULAR | Status: DC | PRN
  Administered 2013-11-13 – 2013-11-14 (×2): 4 mg via INTRAVENOUS
  Filled 2013-11-13 (×2): qty 2

## 2013-11-13 MED ORDER — ROCURONIUM BROMIDE 100 MG/10ML IV SOLN
INTRAVENOUS | Status: AC
  Filled 2013-11-13: qty 1

## 2013-11-13 MED ORDER — LIDOCAINE HCL (CARDIAC) 20 MG/ML IV SOLN
INTRAVENOUS | Status: AC
  Filled 2013-11-13: qty 5

## 2013-11-13 MED ORDER — ESTRADIOL 0.1 MG/GM VA CREA
TOPICAL_CREAM | VAGINAL | Status: AC
  Filled 2013-11-13: qty 42.5

## 2013-11-13 MED ORDER — NALOXONE HCL 0.4 MG/ML IJ SOLN: 0.4000 mg | INTRAMUSCULAR | Status: DC | PRN

## 2013-11-13 MED ORDER — PHENYLEPHRINE 40 MCG/ML (10ML) SYRINGE FOR IV PUSH (FOR BLOOD PRESSURE SUPPORT)
PREFILLED_SYRINGE | INTRAVENOUS | Status: AC
  Filled 2013-11-13: qty 5

## 2013-11-13 MED ORDER — LACTATED RINGERS IV SOLN
INTRAVENOUS | Status: DC
  Administered 2013-11-13 (×4): via INTRAVENOUS

## 2013-11-13 MED ORDER — BUPIVACAINE-EPINEPHRINE 0.5% -1:200000 IJ SOLN
INTRAMUSCULAR | Status: DC | PRN
  Administered 2013-11-13: 30 mL

## 2013-11-13 MED ORDER — EPHEDRINE SULFATE 50 MG/ML IJ SOLN
INTRAMUSCULAR | Status: DC | PRN
  Administered 2013-11-13: 10 mg via INTRAVENOUS
  Administered 2013-11-13: 5 mg via INTRAVENOUS

## 2013-11-13 MED ORDER — MENTHOL 3 MG MT LOZG: 1.0000 | LOZENGE | OROMUCOSAL | Status: DC | PRN

## 2013-11-13 MED ORDER — ROCURONIUM BROMIDE 100 MG/10ML IV SOLN
INTRAVENOUS | Status: DC | PRN
  Administered 2013-11-13: 40 mg via INTRAVENOUS

## 2013-11-13 MED ORDER — METOCLOPRAMIDE HCL 5 MG/ML IJ SOLN: 10.0000 mg | Freq: Once | INTRAMUSCULAR | Status: DC | PRN

## 2013-11-13 MED ORDER — BUPIVACAINE-EPINEPHRINE (PF) 0.5% -1:200000 IJ SOLN
INTRAMUSCULAR | Status: AC
  Filled 2013-11-13: qty 30

## 2013-11-13 MED ORDER — LEVOTHYROXINE SODIUM 75 MCG PO TABS: 75.0000 ug | ORAL_TABLET | Freq: Every day | ORAL | Status: DC

## 2013-11-13 MED ORDER — NEOSTIGMINE METHYLSULFATE 10 MG/10ML IV SOLN
INTRAVENOUS | Status: DC | PRN
  Administered 2013-11-13: 3 mg via INTRAVENOUS

## 2013-11-13 MED ORDER — DIPHENHYDRAMINE HCL 12.5 MG/5ML PO ELIX: 12.5000 mg | ORAL_SOLUTION | Freq: Four times a day (QID) | ORAL | Status: DC | PRN

## 2013-11-13 MED ORDER — HYDROMORPHONE 0.3 MG/ML IV SOLN
INTRAVENOUS | Status: DC
  Administered 2013-11-13: 12:00:00 via INTRAVENOUS
  Administered 2013-11-13: 0.6 mg via INTRAVENOUS
  Administered 2013-11-13: 0.399 mg via INTRAVENOUS
  Administered 2013-11-13: 0.2 mg via INTRAVENOUS
  Filled 2013-11-13: qty 25

## 2013-11-13 MED ORDER — GLYCOPYRROLATE 0.2 MG/ML IJ SOLN
INTRAMUSCULAR | Status: DC | PRN
  Administered 2013-11-13: .6 mg via INTRAVENOUS

## 2013-11-13 MED ORDER — KETOROLAC TROMETHAMINE 30 MG/ML IJ SOLN
INTRAMUSCULAR | Status: AC
  Filled 2013-11-13: qty 1

## 2013-11-13 MED ORDER — TRAMADOL HCL 50 MG PO TABS
50.0000 mg | ORAL_TABLET | Freq: Four times a day (QID) | ORAL | Status: DC | PRN
  Administered 2013-11-13: 50 mg via ORAL
  Filled 2013-11-13 (×2): qty 1

## 2013-11-13 MED ORDER — EPHEDRINE SULFATE 50 MG/ML IJ SOLN
INTRAMUSCULAR | Status: AC
  Filled 2013-11-13: qty 1

## 2013-11-13 MED ORDER — DIPHENHYDRAMINE HCL 50 MG/ML IJ SOLN: 12.5000 mg | Freq: Four times a day (QID) | INTRAMUSCULAR | Status: DC | PRN

## 2013-11-13 MED ORDER — HYDROMORPHONE HCL PF 1 MG/ML IJ SOLN
INTRAMUSCULAR | Status: DC | PRN
  Administered 2013-11-13 (×2): 0.5 mg via INTRAVENOUS

## 2013-11-13 MED ORDER — HYDROMORPHONE HCL PF 1 MG/ML IJ SOLN
0.2500 mg | INTRAMUSCULAR | Status: DC | PRN
Start: 2013-11-13 — End: 2013-11-13
  Administered 2013-11-13 (×2): 0.5 mg via INTRAVENOUS

## 2013-11-13 MED ORDER — FENTANYL CITRATE 0.05 MG/ML IJ SOLN
INTRAMUSCULAR | Status: AC
Start: 2013-11-13 — End: 2013-11-13
  Filled 2013-11-13: qty 5

## 2013-11-13 MED ORDER — ACETAMINOPHEN 160 MG/5ML PO SOLN
975.0000 mg | Freq: Once | ORAL | Status: AC
  Administered 2013-11-13: 975 mg via ORAL

## 2013-11-13 MED ORDER — LACTATED RINGERS IV SOLN
INTRAVENOUS | Status: DC
  Administered 2013-11-13: 07:00:00 via INTRAVENOUS

## 2013-11-13 MED ORDER — PROPOFOL 10 MG/ML IV EMUL
INTRAVENOUS | Status: AC
  Filled 2013-11-13: qty 20

## 2013-11-13 MED ORDER — ONDANSETRON HCL 4 MG/2ML IJ SOLN
INTRAMUSCULAR | Status: AC
  Filled 2013-11-13: qty 2

## 2013-11-13 MED ORDER — MIDAZOLAM HCL 2 MG/2ML IJ SOLN
INTRAMUSCULAR | Status: AC
  Filled 2013-11-13: qty 2

## 2013-11-13 MED ORDER — MEPERIDINE HCL 25 MG/ML IJ SOLN: 6.2500 mg | INTRAMUSCULAR | Status: DC | PRN

## 2013-11-13 MED ORDER — HYDROMORPHONE HCL PF 1 MG/ML IJ SOLN
INTRAMUSCULAR | Status: AC
  Filled 2013-11-13: qty 1

## 2013-11-13 MED ORDER — BUPIVACAINE HCL (PF) 0.25 % IJ SOLN
INTRAMUSCULAR | Status: AC
  Filled 2013-11-13: qty 30

## 2013-11-13 MED ORDER — SODIUM CHLORIDE 0.9 % IJ SOLN
INTRAMUSCULAR | Status: DC | PRN
  Administered 2013-11-13: 3 mL

## 2013-11-13 MED ORDER — ESTRADIOL 0.1 MG/GM VA CREA
TOPICAL_CREAM | VAGINAL | Status: DC | PRN
  Administered 2013-11-13: 1 via VAGINAL

## 2013-11-13 MED ORDER — PHENYLEPHRINE HCL 10 MG/ML IJ SOLN
INTRAMUSCULAR | Status: DC | PRN
  Administered 2013-11-13: 40 ug via INTRAVENOUS
  Administered 2013-11-13 (×3): 80 ug via INTRAVENOUS

## 2013-11-13 MED ORDER — LIDOCAINE HCL (CARDIAC) 20 MG/ML IV SOLN
INTRAVENOUS | Status: DC | PRN
  Administered 2013-11-13: 50 mg via INTRAVENOUS

## 2013-11-13 MED ORDER — BUPIVACAINE HCL (PF) 0.25 % IJ SOLN
INTRAMUSCULAR | Status: DC | PRN
  Administered 2013-11-13: 10 mL

## 2013-11-13 MED ORDER — LEVOTHYROXINE SODIUM 75 MCG PO TABS
75.0000 ug | ORAL_TABLET | ORAL | Status: DC
  Administered 2013-11-14: 75 ug via ORAL
  Filled 2013-11-13: qty 1

## 2013-11-13 MED ORDER — SODIUM CHLORIDE 0.9 % IJ SOLN
INTRAMUSCULAR | Status: AC
  Filled 2013-11-13: qty 10

## 2013-11-13 MED ORDER — MIDAZOLAM HCL 2 MG/2ML IJ SOLN
INTRAMUSCULAR | Status: DC | PRN
  Administered 2013-11-13: 2 mg via INTRAVENOUS

## 2013-11-13 MED ORDER — SERTRALINE HCL 50 MG PO TABS
50.0000 mg | ORAL_TABLET | Freq: Every day | ORAL | Status: DC
  Administered 2013-11-13: 50 mg via ORAL
  Filled 2013-11-13: qty 1

## 2013-11-13 MED ORDER — LACTATED RINGERS IR SOLN
Status: DC | PRN
  Administered 2013-11-13: 3000 mL

## 2013-11-13 MED ORDER — IBUPROFEN 600 MG PO TABS
600.0000 mg | ORAL_TABLET | Freq: Four times a day (QID) | ORAL | Status: DC | PRN
  Administered 2013-11-13: 600 mg via ORAL
  Filled 2013-11-13: qty 1

## 2013-11-13 MED ORDER — FENTANYL CITRATE 0.05 MG/ML IJ SOLN
INTRAMUSCULAR | Status: DC | PRN
  Administered 2013-11-13 (×2): 50 ug via INTRAVENOUS
  Administered 2013-11-13: 100 ug via INTRAVENOUS
  Administered 2013-11-13: 50 ug via INTRAVENOUS

## 2013-11-13 MED ORDER — KETOROLAC TROMETHAMINE 30 MG/ML IJ SOLN
15.0000 mg | Freq: Once | INTRAMUSCULAR | Status: AC | PRN
  Administered 2013-11-13: 30 mg via INTRAVENOUS

## 2013-11-13 MED ORDER — ONDANSETRON HCL 4 MG/2ML IJ SOLN
INTRAMUSCULAR | Status: DC | PRN
  Administered 2013-11-13: 4 mg via INTRAVENOUS

## 2013-11-13 MED ORDER — PROPOFOL 10 MG/ML IV BOLUS
INTRAVENOUS | Status: DC | PRN
  Administered 2013-11-13: 150 mg via INTRAVENOUS
  Administered 2013-11-13: 50 mg via INTRAVENOUS

## 2013-11-13 MED ORDER — LEVOTHYROXINE SODIUM 75 MCG PO TABS: 37.5000 ug | ORAL_TABLET | ORAL | Status: DC

## 2013-11-13 MED ORDER — ACETAMINOPHEN 160 MG/5ML PO SOLN
ORAL | Status: AC
  Administered 2013-11-13: 975 mg via ORAL
  Filled 2013-11-13: qty 40.6

## 2013-11-13 MED ORDER — EPHEDRINE 5 MG/ML INJ
INTRAVENOUS | Status: AC
  Filled 2013-11-13: qty 10

## 2013-11-13 SURGICAL SUPPLY — 57 items
ADH SKN CLS APL DERMABOND .7 (GAUZE/BANDAGES/DRESSINGS) ×2
BARRIER ADHS 3X4 INTERCEED (GAUZE/BANDAGES/DRESSINGS) IMPLANT
BLADE 15 SAFETY STRL DISP (BLADE) ×9 IMPLANT
BRR ADH 4X3 ABS CNTRL BYND (GAUZE/BANDAGES/DRESSINGS)
CABLE HIGH FREQUENCY MONO STRZ (ELECTRODE) IMPLANT
CLOTH BEACON ORANGE TIMEOUT ST (SAFETY) ×3 IMPLANT
CONT PATH 16OZ SNAP LID 3702 (MISCELLANEOUS) ×3 IMPLANT
COVER TABLE BACK 60X90 (DRAPES) ×3 IMPLANT
DECANTER SPIKE VIAL GLASS SM (MISCELLANEOUS) IMPLANT
DERMABOND ADVANCED (GAUZE/BANDAGES/DRESSINGS) ×1
DERMABOND ADVANCED .7 DNX12 (GAUZE/BANDAGES/DRESSINGS) ×2 IMPLANT
DRSG COVADERM PLUS 2X2 (GAUZE/BANDAGES/DRESSINGS) ×6 IMPLANT
DRSG OPSITE POSTOP 3X4 (GAUZE/BANDAGES/DRESSINGS) IMPLANT
DURAPREP 26ML APPLICATOR (WOUND CARE) ×3 IMPLANT
ELECT REM PT RETURN 9FT ADLT (ELECTROSURGICAL) ×3
ELECTRODE REM PT RTRN 9FT ADLT (ELECTROSURGICAL) ×2 IMPLANT
EVACUATOR SMOKE 8.L (FILTER) IMPLANT
GLOVE BIO SURGEON STRL SZ 6.5 (GLOVE) ×3 IMPLANT
GLOVE BIOGEL PI IND STRL 6.5 (GLOVE) ×2 IMPLANT
GLOVE BIOGEL PI IND STRL 7.0 (GLOVE) ×4 IMPLANT
GLOVE BIOGEL PI INDICATOR 6.5 (GLOVE) ×1
GLOVE BIOGEL PI INDICATOR 7.0 (GLOVE) ×2
GOWN STRL REUS W/ TWL LRG LVL3 (GOWN DISPOSABLE) ×14 IMPLANT
GOWN STRL REUS W/TWL LRG LVL3 (GOWN DISPOSABLE) ×33 IMPLANT
HEMOSTAT SURGICEL 2X14 (HEMOSTASIS) ×1 IMPLANT
NDL HYPO 25X5/8 SAFETYGLIDE (NEEDLE) ×2 IMPLANT
NDL SPNL 22GX3.5 QUINCKE BK (NEEDLE) IMPLANT
NEEDLE HYPO 22GX1.5 SAFETY (NEEDLE) IMPLANT
NEEDLE HYPO 25X5/8 SAFETYGLIDE (NEEDLE) ×3 IMPLANT
NEEDLE INSUFFLATION 120MM (ENDOMECHANICALS) ×3 IMPLANT
NEEDLE SPNL 22GX3.5 QUINCKE BK (NEEDLE) IMPLANT
NS IRRIG 1000ML POUR BTL (IV SOLUTION) ×3 IMPLANT
PACK LAVH (CUSTOM PROCEDURE TRAY) ×3 IMPLANT
PACK VAGINAL WOMENS (CUSTOM PROCEDURE TRAY) ×3 IMPLANT
PROTECTOR NERVE ULNAR (MISCELLANEOUS) ×3 IMPLANT
SEALER TISSUE G2 CVD JAW 45CM (ENDOMECHANICALS) ×3 IMPLANT
SET IRRIG TUBING LAPAROSCOPIC (IRRIGATION / IRRIGATOR) IMPLANT
SOLUTION ELECTROLUBE (MISCELLANEOUS) IMPLANT
SUT PROLENE 1 CT 1 30 (SUTURE) IMPLANT
SUT VIC AB 0 CT1 18XCR BRD8 (SUTURE) ×4 IMPLANT
SUT VIC AB 0 CT1 27 (SUTURE) ×12
SUT VIC AB 0 CT1 27XBRD ANBCTR (SUTURE) ×8 IMPLANT
SUT VIC AB 0 CT1 36 (SUTURE) ×9 IMPLANT
SUT VIC AB 0 CT1 8-18 (SUTURE) ×9
SUT VIC AB 2-0 UR5 27 (SUTURE) ×9 IMPLANT
SUT VIC AB 3-0 PS2 18 (SUTURE)
SUT VIC AB 3-0 PS2 18XBRD (SUTURE) IMPLANT
SUT VIC AB 3-0 SH 27 (SUTURE)
SUT VIC AB 3-0 SH 27XBRD (SUTURE) IMPLANT
SUT VICRYL 0 TIES 12 18 (SUTURE) ×3 IMPLANT
SUT VICRYL 0 UR6 27IN ABS (SUTURE) ×3 IMPLANT
TOWEL OR 17X24 6PK STRL BLUE (TOWEL DISPOSABLE) ×6 IMPLANT
TRAY FOLEY CATH 14FR (SET/KITS/TRAYS/PACK) ×3 IMPLANT
TROCAR OPTI TIP 5M 100M (ENDOMECHANICALS) ×2 IMPLANT
TROCAR XCEL DIL TIP R 11M (ENDOMECHANICALS) ×3 IMPLANT
WARMER LAPAROSCOPE (MISCELLANEOUS) ×3 IMPLANT
WATER STERILE IRR 1000ML POUR (IV SOLUTION) ×3 IMPLANT

## 2013-11-13 NOTE — Progress Notes (Signed)
H an P on the chart No significant changes Will proceed with LAVH/BSO Possible anterior and posterior repair Possible SSLS Consent signed

## 2013-11-13 NOTE — Anesthesia Postprocedure Evaluation (Signed)
Anesthesia Post Note  Patient: Shirley Johnson  Procedure(s) Performed: Procedure(s) (LRB): LAPAROSCOPIC ASSISTED VAGINAL HYSTERECTOMY WITH BILATERAL SALPINGO OOPHORECTOMY (N/A) POSTERIOR REPAIR (RECTOCELE),  (N/A)  Anesthesia type: General  Patient location: PACU  Post pain: Pain level controlled  Post assessment: Post-op Vital signs reviewed  Last Vitals:  Filed Vitals:   11/13/13 1030  BP: 127/77  Pulse: 79  Temp:   Resp: 12    Post vital signs: Reviewed  Level of consciousness: sedated  Complications: No apparent anesthesia complications

## 2013-11-13 NOTE — Anesthesia Postprocedure Evaluation (Signed)
Anesthesia Post Note  Patient: Shirley Johnson  Procedure(s) Performed: Procedure(s) (LRB): LAPAROSCOPIC ASSISTED VAGINAL HYSTERECTOMY WITH BILATERAL SALPINGO OOPHORECTOMY (N/A) POSTERIOR REPAIR (RECTOCELE),  (N/A)  Anesthesia type: General  Patient location: Women[facility] Unit  Post pain: Pain level controlled  Post assessment: Post-op Vital signs reviewed  Last Vitals:  Filed Vitals:   11/13/13 1925  BP:   Pulse:   Temp:   Resp: 14    Post vital signs: Reviewed  Level of consciousness: sedated  Complications: No apparent anesthesia complications

## 2013-11-13 NOTE — Brief Op Note (Signed)
11/13/2013  9:41 AM  PATIENT:  Shirley Johnson  58 y.o. female  PRE-OPERATIVE DIAGNOSIS:  pelvic relaxation, rectocele, uterine prolapse  POST-OPERATIVE DIAGNOSIS:  pelvic relaxation, rectocele, uterine prolapse  PROCEDURE:  Procedure(s): LAPAROSCOPIC ASSISTED VAGINAL HYSTERECTOMY WITH BILATERAL SALPINGO OOPHORECTOMY (N/A) POSTERIOR REPAIR (RECTOCELE),  (N/A)  SURGEON:  Surgeon(s) and Role:    * Cyril Mourning, MD - Primary    * Allena Katz, MD - Assisting  PHYSICIAN ASSISTANT:   ASSISTANTS: none   ANESTHESIA:   general  EBL:  Total I/O In: 2000 [I.V.:2000] Out: 500 [Urine:200; Blood:300]  BLOOD ADMINISTERED:none  DRAINS: Urinary Catheter (Foley)   LOCAL MEDICATIONS USED:  LIDOCAINE   SPECIMEN:  Source of Specimen:  uterus, cervix, tubes and ovaries  DISPOSITION OF SPECIMEN:  PATHOLOGY  COUNTS:  YES  TOURNIQUET:  * No tourniquets in log *  DICTATION: .Other Dictation: Dictation Number [id number]  PLAN OF CARE: Admit for overnight observation  PATIENT DISPOSITION:  PACU - hemodynamically stable.   Delay start of Pharmacological VTE agent (>24hrs) due to surgical blood loss or risk of bleeding: not applicable

## 2013-11-13 NOTE — Transfer of Care (Signed)
Immediate Anesthesia Transfer of Care Note  Patient: PATSEY PITSTICK  Procedure(s) Performed: Procedure(s): LAPAROSCOPIC ASSISTED VAGINAL HYSTERECTOMY WITH BILATERAL SALPINGO OOPHORECTOMY (N/A) POSTERIOR REPAIR (RECTOCELE),  (N/A)  Patient Location: PACU  Anesthesia Type:General  Level of Consciousness: awake, alert  and oriented  Airway & Oxygen Therapy: Patient Spontanous Breathing and Patient connected to nasal cannula oxygen  Post-op Assessment: Report given to PACU RN and Post -op Vital signs reviewed and stable  Post vital signs: Reviewed and stable  Complications: No apparent anesthesia complications

## 2013-11-13 NOTE — Anesthesia Procedure Notes (Signed)
Procedure Name: Intubation Date/Time: 11/13/2013 7:33 AM Performed by: Jonna Munro Pre-anesthesia Checklist: Patient identified, Emergency Drugs available, Suction available, Patient being monitored and Timeout performed Patient Re-evaluated:Patient Re-evaluated prior to inductionOxygen Delivery Method: Circle system utilized Preoxygenation: Pre-oxygenation with 100% oxygen Intubation Type: IV induction Ventilation: Mask ventilation without difficulty Laryngoscope Size: Mac and 3 Grade View: Grade II Tube type: Oral Tube size: 7.0 mm Number of attempts: 1 Airway Equipment and Method: Stylet Placement Confirmation: ETT inserted through vocal cords under direct vision,  positive ETCO2 and breath sounds checked- equal and bilateral Secured at: 21 cm Tube secured with: Tape Dental Injury: Teeth and Oropharynx as per pre-operative assessment

## 2013-11-13 NOTE — Anesthesia Preprocedure Evaluation (Signed)
Anesthesia Evaluation  Patient identified by MRN, date of birth, ID band Patient awake    Reviewed: Allergy & Precautions, H&P , NPO status , Patient's Chart, lab work & pertinent test results, reviewed documented beta blocker date and time   History of Anesthesia Complications Negative for: history of anesthetic complications  Airway Mallampati: II TM Distance: <3 FB Neck ROM: full    Dental  (+) Teeth Intact   Pulmonary neg pulmonary ROS, former smoker,  breath sounds clear to auscultation  Pulmonary exam normal       Cardiovascular Exercise Tolerance: Good hypertension, On Medications Rhythm:regular Rate:Normal     Neuro/Psych Anxiety Depression negative neurological ROS     GI/Hepatic negative GI ROS, Neg liver ROS,   Endo/Other  Hypothyroidism   Renal/GU negative Renal ROS  Female GU complaint     Musculoskeletal   Abdominal   Peds  Hematology negative hematology ROS (+)   Anesthesia Other Findings   Reproductive/Obstetrics negative OB ROS                           Anesthesia Physical Anesthesia Plan  ASA: II  Anesthesia Plan: General ETT   Post-op Pain Management:    Induction:   Airway Management Planned:   Additional Equipment:   Intra-op Plan:   Post-operative Plan:   Informed Consent: I have reviewed the patients History and Physical, chart, labs and discussed the procedure including the risks, benefits and alternatives for the proposed anesthesia with the patient or authorized representative who has indicated his/her understanding and acceptance.   Dental Advisory Given  Plan Discussed with: CRNA and Surgeon  Anesthesia Plan Comments:         Anesthesia Quick Evaluation

## 2013-11-13 NOTE — Addendum Note (Signed)
Addendum created 11/13/13 2056 by Asher Muir, CRNA   Modules edited: Notes Section   Notes Section:  File: [id number]

## 2013-11-14 ENCOUNTER — Encounter (HOSPITAL_COMMUNITY): Payer: Self-pay | Admitting: Obstetrics and Gynecology

## 2013-11-14 LAB — BASIC METABOLIC PANEL
ANION GAP: 10 (ref 5–15)
BUN: 8 mg/dL (ref 6–23)
CO2: 30 mEq/L (ref 19–32)
Calcium: 8.8 mg/dL (ref 8.4–10.5)
Chloride: 97 mEq/L (ref 96–112)
Creatinine, Ser: 0.58 mg/dL (ref 0.50–1.10)
GFR calc Af Amer: >90 mL/min (ref >90)
GLUCOSE: 118 mg/dL — AB (ref 70–99)
Potassium: 3 mEq/L — ABNORMAL LOW (ref 3.7–5.3)
SODIUM: 137 meq/L (ref 137–147)

## 2013-11-14 LAB — CBC
HEMATOCRIT: 32.8 % — AB (ref 36.0–46.0)
Hemoglobin: 11.3 g/dL — ABNORMAL LOW (ref 12.0–15.0)
MCH: 29.3 pg (ref 26.0–34.0)
MCHC: 34.5 g/dL (ref 30.0–36.0)
MCV: 85 fL (ref 78.0–100.0)
PLATELETS: 214 10*3/uL (ref 150–400)
RBC: 3.86 MIL/uL — ABNORMAL LOW (ref 3.87–5.11)
RDW: 13.1 % (ref 11.5–15.5)
WBC: 10.8 10*3/uL — AB (ref 4.0–10.5)

## 2013-11-14 MED ORDER — IBUPROFEN 600 MG PO TABS: 600.0000 mg | ORAL_TABLET | Freq: Four times a day (QID) | ORAL | Status: AC | PRN

## 2013-11-14 MED ORDER — OXYCODONE-ACETAMINOPHEN 5-325 MG PO TABS
1.0000 | ORAL_TABLET | ORAL | Status: DC | PRN
  Administered 2013-11-14: 2 via ORAL
  Filled 2013-11-14: qty 2

## 2013-11-14 MED ORDER — OXYCODONE-ACETAMINOPHEN 5-325 MG PO TABS: 1.0000 | ORAL_TABLET | ORAL | Status: DC | PRN

## 2013-11-14 MED ORDER — METHOCARBAMOL 500 MG PO TABS
500.0000 mg | ORAL_TABLET | Freq: Four times a day (QID) | ORAL | Status: DC | PRN
  Filled 2013-11-14: qty 1

## 2013-11-14 NOTE — Progress Notes (Signed)
Pt  Is discharged in the care of daugther. Downstairs with R.N. Judd Lien. Denies any further pain or discomfort. Discharge instructions with Rx were given to pt. Questions were asked and answered. Stable. No distress.No equioment neede for home use.

## 2013-11-14 NOTE — Progress Notes (Signed)
1 Day Post-Op Procedure(s) (LRB): LAPAROSCOPIC ASSISTED VAGINAL HYSTERECTOMY WITH BILATERAL SALPINGO OOPHORECTOMY (N/A) POSTERIOR REPAIR (RECTOCELE),  (N/A)  Subjective: Patient reports tolerating PO, + flatus and no problems voiding.    Objective: I have reviewed patient's vital signs, intake and output, medications and labs.  General: alert, cooperative and appears stated age Vaginal Bleeding: none none  Assessment: s/p Procedure(s): LAPAROSCOPIC ASSISTED VAGINAL HYSTERECTOMY WITH BILATERAL SALPINGO OOPHORECTOMY (N/A) POSTERIOR REPAIR (RECTOCELE),  (N/A): stable, progressing well and tolerating diet  Plan: Advance diet Encourage ambulation Discharge home  LOS: 1 day    Shada Nienaber L 11/14/2013, 8:33 AM

## 2013-11-14 NOTE — Discharge Summary (Signed)
Admission Diagnosis: Pelvic Relaxation  Discharge Diagnosis: Same  Hospital Course: 58 year old female admitted underwent LAVH, BSO, Posterior Repair. She did very well post op. By POD#1 her hemoglobin was 11. She was voiding without problem tolerating regular diet and had very little pain. I discharged her home in good condition. Discharge meds : Ibuprofen, Percocet prn and Robaxin prn Discharge instructions given Follow up in 1 week

## 2013-11-14 NOTE — Op Note (Signed)
[name], Shirley Johnson                ACCOUNT NO.:  [id number]  MEDICAL RECORD NO.:  [id number]  LOCATION:  3762                          FACILITY:  [facility]  PHYSICIAN:  Harika Laidlaw L. Jalie Eiland, M.D.DATE OF BIRTH:  [birth year]  DATE OF PROCEDURE:  11/13/2013 DATE OF DISCHARGE:                              OPERATIVE REPORT   PREOPERATIVE DIAGNOSES:  Pelvic relaxation, uterine prolapse, and symptomatic rectocele.  POSTOPERATIVE DIAGNOSES:  Pelvic relaxation, uterine prolapse, and symptomatic rectocele.  PROCEDURE:  Laparoscopic-assisted vaginal hysterectomy and bilateral salpingo-oophorectomy on posterior repair.  SURGEON:  Hanalei Glace L. Helane Rima, M.D.  ANESTHESIA:  General.  EBL:  300 mL.  COMPLICATIONS:  None.  DRAINS:  Foley catheter and vaginal packing.  DESCRIPTION OF PROCEDURE:  The patient was taken to the operating room. She was intubated, she was prepped and draped in usual sterile fashion. An uterine manipulator was inserted.  Attention was turned to the abdomen where a small infraumbilical incision was made.  The Veress needle was inserted.  Pneumoperitoneum was performed.  After that, an 11- mm trocar was inserted and laparoscope was introduced through the trocar sheath.  No abdominal or intestinal injury, or bleeding was noted.  A 5- mm trocar was inserted suprapubically under direct visualization.  The patient was placed in Trendelenburg and standard fashion.  Exam of the pelvis revealed normal uterus, normal fallopian tubes, status post tubal ligation.  She had a very adherent left ovary to the cul-de-sac and to the left sidewall.  We elevated that with the grasper and we first started with the BSO by identifying the IP ligament on the right, grasping the tube and ovary on the right, placing the EnSeal across the IP ligament and carrying it down across the mesosalpinx to the round ligament.  This was done with excellent visualization and excellent hemostasis with excellent  visualization of the ureter, which was well away from our field.  This was done on the left side in standard fashion as well.  We then converted down vaginally, placed a weighted speculum in the vagina, made a circumferential incision around the cervix.  The posterior cul-de-sac was stuck down I think because of those adhesions that I had noted.  We entered that with Mayo scissors.  The anterior cul- de-sac was entered sharply using Metzenbaum scissors.  We used a series of straight, curved Heaney clamps, placed snug against the cervix and uterus, and clamped and it was carried all the way.  Each pedicle was clamped, cut, and suture ligated using 0 Vicryl suture.  We walked our way up the broad ligament.  Each pedicle was clamped, cut, and suture ligated using 0 Vicryl suture.  The uterus was retroflexed.  The remainder of the broad ligament was clamped on either side.  The specimen was removed, identified the cervix, uterus, tubes, and ovaries and sent to Pathology.  The remainder of the pedicles were secured using a free tie of 0 Vicryl suture.  The posterior cuff was oozing, I think because of the those adhesions.  I did place a series of figure-of- eights for hemostasis.  We then closed the cuff posteriorly using 0 Vicryl suture and then anterior to posterior using 0  Vicryl suture.  At this point, we then noted that she had excellent support of the vaginal cuff and excellent support anteriorly.  She did not need any anterior repair or a sacrospinous ligament fixation.  However, she did have a grade 2-3 rectocele.  So, we proceeded with the rectocele repair.  We made a V-shaped incision at the perineum, made basically an incision all the way up the back of the vagina with careful attention to dissect the rectovaginal fascia away from the vaginal epithelium.  We reduced the rectocele and then closed the rectocele using interrupted using 0 Vicryl suture, starting distally, moving  proximally.  We trimmed the redundant vaginal epithelium and then closed the vagina using 2-0 Vicryl running locked stitch and then reinforced the perineum using 0 Vicryl suture.  I then placed packing in the vagina with Estrace cream and then went back up to the abdomen.  I replaced the pneumoperitoneum.  There was a small amount of oozing in the vaginal cuff and I placed two pieces of EnSeal there because there was really nothing to Bovie and hemostasis was excellent.  I observed that under decreased intraperitoneal pressure as well.  I then inserted the remainder of the Marcaine intraperitoneally to help with her postop pain as well.  The pneumoperitoneum was released.  The trocars were removed.  Closed both incisions with 3-0 Vicryl interrupted.  A pressure dressing was applied to each site.  The vaginal packing was dry.  Urine output was clear and was good.  All sponge, lap, and instrument counts were correct x2.  The patient went to recovery room in stable condition.     Myanna Ziesmer L. Helane Rima, M.D.     Nevin Bloodgood  D:  11/13/2013  T:  11/14/2013  Job:  [id number]

## 2013-11-30 ENCOUNTER — Encounter: Payer: Self-pay | Admitting: Internal Medicine

## 2013-12-25 ENCOUNTER — Ambulatory Visit (INDEPENDENT_AMBULATORY_CARE_PROVIDER_SITE_OTHER): Payer: BC Managed Care – PPO | Admitting: Physician Assistant

## 2013-12-25 ENCOUNTER — Encounter: Payer: Self-pay | Admitting: Physician Assistant

## 2013-12-25 VITALS — BP 140/88 | HR 80 | Temp 97.7°F | Resp 16 | Wt 144.0 lb

## 2013-12-25 DIAGNOSIS — Z79899 Other long term (current) drug therapy: Secondary | ICD-10-CM

## 2013-12-25 DIAGNOSIS — E079 Disorder of thyroid, unspecified: Secondary | ICD-10-CM

## 2013-12-25 DIAGNOSIS — I10 Essential (primary) hypertension: Secondary | ICD-10-CM

## 2013-12-25 DIAGNOSIS — F329 Major depressive disorder, single episode, unspecified: Secondary | ICD-10-CM

## 2013-12-25 DIAGNOSIS — E559 Vitamin D deficiency, unspecified: Secondary | ICD-10-CM

## 2013-12-25 DIAGNOSIS — R7309 Other abnormal glucose: Secondary | ICD-10-CM

## 2013-12-25 DIAGNOSIS — M533 Sacrococcygeal disorders, not elsewhere classified: Secondary | ICD-10-CM

## 2013-12-25 DIAGNOSIS — F3289 Other specified depressive episodes: Secondary | ICD-10-CM

## 2013-12-25 DIAGNOSIS — F32A Depression, unspecified: Secondary | ICD-10-CM

## 2013-12-25 DIAGNOSIS — R946 Abnormal results of thyroid function studies: Secondary | ICD-10-CM

## 2013-12-25 DIAGNOSIS — E782 Mixed hyperlipidemia: Secondary | ICD-10-CM

## 2013-12-25 LAB — CBC WITH DIFFERENTIAL/PLATELET
BASOS PCT: 1 % (ref 0–1)
Basophils Absolute: 0.1 10*3/uL (ref 0.0–0.1)
Eosinophils Absolute: 0.2 10*3/uL (ref 0.0–0.7)
Eosinophils Relative: 3 % (ref 0–5)
HEMATOCRIT: 39.7 % (ref 36.0–46.0)
HEMOGLOBIN: 13.7 g/dL (ref 12.0–15.0)
LYMPHS ABS: 1.8 10*3/uL (ref 0.7–4.0)
Lymphocytes Relative: 29 % (ref 12–46)
MCH: 28.9 pg (ref 26.0–34.0)
MCHC: 34.5 g/dL (ref 30.0–36.0)
MCV: 83.8 fL (ref 78.0–100.0)
MONO ABS: 0.5 10*3/uL (ref 0.1–1.0)
MONOS PCT: 8 % (ref 3–12)
NEUTROS ABS: 3.7 10*3/uL (ref 1.7–7.7)
NEUTROS PCT: 59 % (ref 43–77)
Platelets: 306 10*3/uL (ref 150–400)
RBC: 4.74 MIL/uL (ref 3.87–5.11)
RDW: 13.5 % (ref 11.5–15.5)
WBC: 6.3 10*3/uL (ref 4.0–10.5)

## 2013-12-25 MED ORDER — TRAMADOL HCL 50 MG PO TABS: 50.0000 mg | ORAL_TABLET | Freq: Four times a day (QID) | ORAL | Status: AC | PRN

## 2013-12-25 NOTE — Progress Notes (Signed)
Assessment and Plan:  Hypertension: Continue medication, monitor blood pressure at home. Continue DASH diet. Cholesterol: Continue diet and exercise. Check cholesterol.  Pre-diabetes-Continue diet and exercise. Check A1C Vitamin D Def- check level and continue medications.  Hypothyroidism-check TSH level, continue medications the same. - will refer to endocrine per PT request Suggest trying prilosec for possible GERD Joint pain- [id number], ANA Lower back pain/constipation- try to get off narcotics, will give 1 prescription of Tramadol and she will follow up with Dr. Ellene Route   Continue diet and meds as discussed. Further disposition pending results of labs.  HPI 58 y.o. female  presents for 3 month follow up with hypertension, hyperlipidemia, prediabetes and vitamin D. Her blood pressure has been controlled at home, today their BP is BP: 140/88 mmHg She does not workout. She denies chest pain, shortness of breath, dizziness.  She is not on cholesterol medication and denies myalgias. Her cholesterol is at goal. The cholesterol last visit was:   Lab Results  Component Value Date   CHOL 161 09/07/2013   HDL 38* 09/07/2013   LDLCALC 88 09/07/2013   TRIG 174* 09/07/2013   CHOLHDL 4.2 09/07/2013    Last A1C in the office was:  Lab Results  Component Value Date   HGBA1C 5.2 05/30/2013   Patient is on Vitamin D supplement.   Lab Results  Component Value Date   VD25OH 23 09/07/2013     She is on thyroid medication. Her medication was not changed last visit. Patient denies heat / cold intolerance, nervousness and palpitations. She states she has had a fullness in her throat, tightness in her throat and would like an evaluation with Dr. Buddy Duty about her thyroid. She states since being on thyroid med she has less constipation, less mental fog. She does have bilateral hand/wrist pain, has back pain/SI pain, and she has nail ridging/spliting. She has had a negative RF, sed rate, CPK, antiDNA. Lab Results   Component Value Date   TSH 0.529 10/16/2013  .  Recent bladder tack, rectocele and hysterectomy which has helped some her back pain but she is still having some back pain and will see Dr. Ellene Route for this, she is on Robaxin twice daily. She is planning on making an appointment for an injection. She is on roxicodone for pain currently but only has about 1 week left.    Current Medications:  Current Outpatient Prescriptions on File Prior to Visit  Medication Sig Dispense Refill  . ALPRAZolam (XANAX) 1 MG tablet Take 1 mg by mouth at bedtime.      . Cholecalciferol (VITAMIN D) 2000 UNITS tablet Take 4,000 Units by mouth daily. Takes 4000 units daily      . ibuprofen (ADVIL,MOTRIN) 600 MG tablet Take 1 tablet (600 mg total) by mouth every 6 (six) hours as needed (mild pain).  60 tablet  0  . levothyroxine (SYNTHROID, LEVOTHROID) 75 MCG tablet Take 75 mcg by mouth daily before breakfast. MONDAYS AND FRIDAYS ONLY 1/2 TABLET.      . methocarbamol (ROBAXIN) 500 MG tablet Take 500 mg by mouth 2 (two) times daily.      . Multiple Vitamin (STRESS B PO) Take by mouth daily.      . naproxen sodium (ANAPROX) 220 MG tablet Take 440 mg by mouth as needed. pain       . oxyCODONE-acetaminophen (PERCOCET/ROXICET) 5-325 MG per tablet Take 1-2 tablets by mouth every 4 (four) hours as needed for severe pain.  60 tablet  0  .  potassium chloride SA (K-DUR,KLOR-CON) 20 MEQ tablet Take 20 mEq by mouth daily.      . Sennosides (SENOKOT PO) Take by mouth. Takes 2 at bedtime      . sertraline (ZOLOFT) 50 MG tablet Take 50 mg by mouth at bedtime.       . triamterene-hydrochlorothiazide (MAXZIDE) 75-50 MG per tablet TAKE 1 TABLET EVERY MORNING FOR BP AND FLUID  90 tablet  1   No current facility-administered medications on file prior to visit.   Medical History:  Past Medical History  Diagnosis Date  . Allergy     pollen  . Depression   . Hypertension   . Vitamin D deficiency   . Anxiety    Allergies:  Allergies   Allergen Reactions  . Doxycycline Nausea And Vomiting  . Sulfa Antibiotics Nausea And Vomiting  . Vicodin [Hydrocodone-Acetaminophen] Other (See Comments)    Horrible nightmares  . Erythromycin Rash  . Penicillins Rash    Childhood rxn  . Tetracyclines & Related Rash     Review of Systems: [X]  = complains of  [ ]  = denies  General: Fatigue [ ]  Fever [ ]  Chills [ ]  Weakness [ ]   Insomnia [ ]  Eyes: Redness [ ]  Blurred vision [ ]  Diplopia [ ]   ENT: Congestion [ ]  Sinus Pain [ ]  Post Nasal Drip [ ]  Sore Throat [ ]  Earache [ ]   Cardiac: Chest pain/pressure [ ]  SOB [ ]  Orthopnea [ ]   Palpitations [ ]   Paroxysmal nocturnal dyspnea[ ]  Claudication [ ]  Edema [ ]   Pulmonary: Cough [ ]  Wheezing[ ]   SOB [ ]   Snoring [ ]   GI: Nausea [ ]  Vomiting[ ]  Dysphagia[ ]  Heartburn[x ] Abdominal pain [ ]  Constipation [ ] ; Diarrhea [ ] ; BRBPR [ ]  Melena[ ]  GU: Hematuria[ ]  Dysuria [ ]  Nocturia[ ]  Urgency [ ]   Hesitancy [ ]  Discharge [ ]  Neuro: Headaches[ ]  Vertigo[ ]  Paresthesias[ ]  Spasm [ ]  Speech changes [ ]  Incoordination [ ]   Ortho: Arthritis [ ]  Joint pain [ ]  Muscle pain [x ] Joint swelling [ ]  Back Pain [x ] Skin:  Rash [ ]   Pruritis [ ]  Change in skin lesion [ ]   Psych: Depression[ ]  Anxiety[x ] Confusion [ ]  Memory loss [ ]   Heme/Lypmh: Bleeding [ ]  Bruising [ ]  Enlarged lymph nodes [ ]   Endocrine: Visual blurring [ ]  Paresthesia [ ]  Polyuria [ ]  Polydypsea [ ]    Heat/cold intolerance [ ]  Hypoglycemia [ ]   Family history- Review and unchanged Social history- Review and unchanged Physical Exam: BP 140/88  Pulse 80  Temp(Src) 97.7 F (36.5 C)  Resp 16  Wt 144 lb (65.318 kg)  LMP 09/25/2010 Wt Readings from Last 3 Encounters:  12/25/13 144 lb (65.318 kg)  11/13/13 141 lb (63.957 kg)  11/13/13 141 lb (63.957 kg)   General Appearance: Well nourished, in no apparent distress. Eyes: PERRLA, EOMs, conjunctiva no swelling or erythema Sinuses: No Frontal/maxillary tenderness ENT/Mouth: Ext aud  canals clear, TMs without erythema, bulging. No erythema, swelling, or exudate on post pharynx.  Tonsils not swollen or erythematous. Hearing normal.  Neck: Supple, thyroid normal.  Respiratory: Respiratory effort normal, BS equal bilaterally without rales, rhonchi, wheezing or stridor.  Cardio: RRR with no MRGs. Brisk peripheral pulses without edema.  Abdomen: Soft, + BS.  Non tender, no guarding, rebound, hernias, masses. Lymphatics: Non tender without lymphadenopathy.  Musculoskeletal: Full ROM, 5/5 strength, normal gait.  Skin: Warm, dry without rashes,  lesions, ecchymosis.  Neuro: Cranial nerves intact. Normal muscle tone, no cerebellar symptoms. Sensation intact.  Psych: Awake and oriented X 3, normal affect, Insight and Judgment appropriate.    Vicie Mutters 11:44 AM

## 2013-12-25 NOTE — Patient Instructions (Signed)
Take the nexium/protonix for 2-4 more weeks once a day, then switch to pepcid or zantac (generic is fine) 2 x a day for 2 weeks, then once a day for 2 weeks and then stop. Avoid alcohol, spicy foods, NSAIDS (aleve, ibuprofen) at this time. See foods below.   Food Choices for Gastroesophageal Reflux Disease When you have gastroesophageal reflux disease (GERD), the foods you eat and your eating habits are very important. Choosing the right foods can help ease the discomfort of GERD. WHAT GENERAL GUIDELINES DO I NEED TO FOLLOW?  Choose fruits, vegetables, whole grains, low-fat dairy products, and low-fat meat, fish, and poultry.  Limit fats such as oils, salad dressings, butter, nuts, and avocado.  Keep a food diary to identify foods that cause symptoms.  Avoid foods that cause reflux. These may be different for different people.  Eat frequent small meals instead of three large meals each day.  Eat your meals slowly, in a relaxed setting.  Limit fried foods.  Cook foods using methods other than frying.  Avoid drinking alcohol.  Avoid drinking large amounts of liquids with your meals.  Avoid bending over or lying down until 2-3 hours after eating. WHAT FOODS ARE NOT RECOMMENDED? The following are some foods and drinks that may worsen your symptoms: Vegetables Tomatoes. Tomato juice. Tomato and spaghetti sauce. Chili peppers. Onion and garlic. Horseradish. Fruits Oranges, grapefruit, and lemon (fruit and juice). Meats High-fat meats, fish, and poultry. This includes hot dogs, ribs, ham, sausage, salami, and bacon. Dairy Whole milk and chocolate milk. Sour cream. Cream. Butter. Ice cream. Cream cheese.  Beverages Coffee and tea, with or without caffeine. Carbonated beverages or energy drinks. Condiments Hot sauce. Barbecue sauce.  Sweets/Desserts Chocolate and cocoa. Donuts. Peppermint and spearmint. Fats and Oils High-fat foods, including French fries and potato  chips. Other Vinegar. Strong spices, such as black pepper, white pepper, red pepper, cayenne, curry powder, cloves, ginger, and chili powder.  

## 2013-12-26 LAB — HEPATIC FUNCTION PANEL
ALK PHOS: 82 U/L (ref 39–117)
ALT: 22 U/L (ref 0–35)
AST: 19 U/L (ref 0–37)
Albumin: 4.9 g/dL (ref 3.5–5.2)
BILIRUBIN DIRECT: 0.1 mg/dL (ref 0.0–0.3)
BILIRUBIN INDIRECT: 0.3 mg/dL (ref 0.2–1.2)
TOTAL PROTEIN: 8 g/dL (ref 6.0–8.3)
Total Bilirubin: 0.4 mg/dL (ref 0.2–1.2)

## 2013-12-26 LAB — MAGNESIUM: MAGNESIUM: 1.9 mg/dL (ref 1.5–2.5)

## 2013-12-26 LAB — BASIC METABOLIC PANEL WITH GFR
BUN: 22 mg/dL (ref 6–23)
CHLORIDE: 98 meq/L (ref 96–112)
CO2: 28 meq/L (ref 19–32)
Calcium: 10.2 mg/dL (ref 8.4–10.5)
Creat: 0.75 mg/dL (ref 0.50–1.10)
GFR, Est African American: >89 mL/min
GFR, Est Non African American: 89 mL/min
GLUCOSE: 81 mg/dL (ref 70–99)
POTASSIUM: 3.7 meq/L (ref 3.5–5.3)
SODIUM: 136 meq/L (ref 135–145)

## 2013-12-26 LAB — LIPID PANEL
Cholesterol: 168 mg/dL (ref 0–200)
HDL: 34 mg/dL — ABNORMAL LOW (ref >39)
LDL CALC: 88 mg/dL (ref 0–99)
Total CHOL/HDL Ratio: 4.9 Ratio
Triglycerides: 232 mg/dL — ABNORMAL HIGH (ref <150)
VLDL: 46 mg/dL — AB (ref 0–40)

## 2013-12-26 LAB — ANA: ANA: NEGATIVE

## 2013-12-26 LAB — VITAMIN D 25 HYDROXY (VIT D DEFICIENCY, FRACTURES): Vit D, 25-Hydroxy: 89 ng/mL (ref 30–89)

## 2013-12-26 LAB — TSH: TSH: 0.52 u[IU]/mL (ref 0.350–4.500)

## 2013-12-28 LAB — HLA-B27 ANTIGEN: DNA Result:: NOT DETECTED

## 2013-12-31 ENCOUNTER — Telehealth: Payer: Self-pay | Admitting: *Deleted

## 2013-12-31 NOTE — Telephone Encounter (Signed)
PLEASE HAVE PT PAGED Pt is calling said she thought she was to see Dr Buddy Duty  But got  NP forms for Dr Loanne Drilling? Will you please check on this & call pt. DONE=

## 2014-01-01 NOTE — Telephone Encounter (Signed)
lmom for pt to return call. °

## 2014-01-01 NOTE — Telephone Encounter (Signed)
Pt is going to keep appt w/ Dr.Ellison.

## 2014-01-05 ENCOUNTER — Other Ambulatory Visit: Payer: Self-pay | Admitting: Physician Assistant

## 2014-01-09 ENCOUNTER — Encounter: Payer: Self-pay | Admitting: Endocrinology

## 2014-01-09 ENCOUNTER — Ambulatory Visit (INDEPENDENT_AMBULATORY_CARE_PROVIDER_SITE_OTHER): Payer: BC Managed Care – PPO | Admitting: Endocrinology

## 2014-01-09 VITALS — BP 144/98 | HR 109 | Temp 98.4°F | Ht 59.0 in

## 2014-01-09 DIAGNOSIS — E039 Hypothyroidism, unspecified: Secondary | ICD-10-CM

## 2014-01-09 NOTE — Progress Notes (Signed)
Subjective:    Patient ID: Shirley Johnson, female    DOB: 1956/02/18, 58 y.o.   MRN: [id number]  HPI Pt states few years of moderate hair loss throughout the head, and assoc constipation.  She was noted in mid-2015 to have elevated TSH, and was rx'ed levothyroxine.  Since then, she feels better.   Past Medical History  Diagnosis Date  . Allergy     pollen  . Depression   . Hypertension   . Vitamin D deficiency   . Anxiety     Past Surgical History  Procedure Laterality Date  . Back surgery    . Tubal ligation    . Sigmoidoscopy    . Hemorrhoid surgery      banding  . Laparoscopic assisted vaginal hysterectomy N/A 11/13/2013    Procedure: LAPAROSCOPIC ASSISTED VAGINAL HYSTERECTOMY WITH BILATERAL SALPINGO OOPHORECTOMY;  Surgeon: Cyril Mourning, MD;  Location: [facility];  Service: Gynecology;  Laterality: N/A;  . Anterior and posterior repair N/A 11/13/2013    Procedure: POSTERIOR REPAIR (RECTOCELE), ;  Surgeon: Cyril Mourning, MD;  Location: [facility];  Service: Gynecology;  Laterality: N/A;    History   Social History  . Marital Status: Divorced    Spouse Name: N/A    Number of Children: N/A  . Years of Education: N/A   Occupational History  . Not on file.   Social History Main Topics  . Smoking status: Former Smoker -- 0.80 packs/day    Types: Cigarettes    Quit date: 02/07/2013  . Smokeless tobacco: Never Used  . Alcohol Use: No     Comment: Rarely  . Drug Use: No  . Sexual Activity: Yes     Comment: tubal ligation   Other Topics Concern  . Not on file   Social History Narrative  . No narrative on file    Current Outpatient Prescriptions on File Prior to Visit  Medication Sig Dispense Refill  . ALPRAZolam (XANAX) 1 MG tablet Take 1 mg by mouth at bedtime.      . Cholecalciferol (VITAMIN D) 2000 UNITS tablet Take 4,000 Units by mouth daily. Takes 4000 units daily      . ibuprofen (ADVIL,MOTRIN) 600 MG tablet Take 1 tablet (600 mg total) by mouth every 6  (six) hours as needed (mild pain).  60 tablet  0  . methocarbamol (ROBAXIN) 500 MG tablet Take 500 mg by mouth 2 (two) times daily.      . Multiple Vitamin (STRESS B PO) Take by mouth daily.      . naproxen sodium (ANAPROX) 220 MG tablet Take 440 mg by mouth as needed. pain       . potassium chloride SA (K-DUR,KLOR-CON) 20 MEQ tablet Take 20 mEq by mouth daily.      . Sennosides (SENOKOT PO) Take by mouth. Takes 2 at bedtime      . sertraline (ZOLOFT) 50 MG tablet Take 50 mg by mouth at bedtime.       . triamterene-hydrochlorothiazide (MAXZIDE) 75-50 MG per tablet TAKE 1 TABLET EVERY MORNING FOR BP AND FLUID  90 tablet  1  . oxyCODONE-acetaminophen (PERCOCET/ROXICET) 5-325 MG per tablet Take 1-2 tablets by mouth every 4 (four) hours as needed for severe pain.  60 tablet  0  . traMADol (ULTRAM) 50 MG tablet Take 1 tablet (50 mg total) by mouth every 6 (six) hours as needed.  90 tablet  0   No current facility-administered medications on file prior to visit.  Allergies  Allergen Reactions  . Doxycycline Nausea And Vomiting  . Sulfa Antibiotics Nausea And Vomiting  . Vicodin [Hydrocodone-Acetaminophen] Other (See Comments)    Horrible nightmares  . Erythromycin Rash  . Penicillins Rash    Childhood rxn  . Tetracyclines & Related Rash    Family History  Problem Relation Age of Onset  . Hypertension Mother   . COPD Mother   . Alzheimer's disease Mother   . Heart disease Father   . Cancer Father     Prostate  . Thyroid disease Father     BP 144/98  Pulse 109  Temp(Src) 98.4 F (36.9 C) (Oral)  Ht 4\' 11"  (1.499 m)  SpO2 91%  LMP 09/25/2010   Review of Systems denies muscle cramps, sob, memory loss, numbness, blurry vision, easy bruising, and syncope.  She has dry skin, depression, cold intolerance, myalgias, rhinorrhea, and weight gain.  Constipation is improved.    Objective:   Physical Exam VS: see vs page GEN: no distress HEAD: head: no deformity eyes: no  periorbital swelling, no proptosis external nose and ears are normal mouth: no lesion seen NECK: supple, thyroid is not enlarged.  i cannot feel the nodule. CHEST WALL: no deformity LUNGS:  Clear to auscultation CV: reg rate and rhythm, no murmur ABD: abdomen is soft, nontender.  no hepatosplenomegaly.  not distended.  no hernia MUSCULOSKELETAL: muscle bulk and strength are grossly normal.  no obvious joint swelling.  gait is normal and steady EXTEMITIES: no deformity.  no ulcer on the feet.  feet are of normal color and temp.  Trace bilat leg edema PULSES: dorsalis pedis intact bilat.  no carotid bruit NEURO:  cn 2-12 grossly intact.   readily moves all 4's.  sensation is intact to touch on the feet SKIN:  Normal texture and temperature.  No rash or suspicious lesion is visible.   NODES:  None palpable at the neck PSYCH: alert, well-oriented.  Does not appear anxious nor depressed.    (I reviewed [place] report) Lab Results  Component Value Date   TSH 0.520 12/25/2013   i have reviewed the following outside records: Office notes    Assessment & Plan:  Small thyroid nodule.  In the context of hypothyroidism, this may be due to chronic thyroiditis, rather than an adenoma. Hypothyroidism, new: well-replaced. Hair loss, not thyroid-related   Patient is advised the following: Patient Instructions  Please continue the same thyroid medication. You should have your neck examined next year.  i would be happy to do this, or I'm sure your PCP would also be happy to. I would be happy to see you back here whenever you want.

## 2014-01-09 NOTE — Patient Instructions (Signed)
Please continue the same thyroid medication. You should have your neck examined next year.  i would be happy to do this, or I'm sure your PCP would also be happy to. I would be happy to see you back here whenever you want.

## 2014-01-10 DIAGNOSIS — E039 Hypothyroidism, unspecified: Secondary | ICD-10-CM | POA: Insufficient documentation

## 2014-01-14 ENCOUNTER — Other Ambulatory Visit: Payer: Self-pay | Admitting: Physician Assistant

## 2014-01-16 ENCOUNTER — Other Ambulatory Visit: Payer: Self-pay | Admitting: Physician Assistant

## 2014-01-30 ENCOUNTER — Other Ambulatory Visit: Payer: Self-pay | Admitting: Emergency Medicine

## 2014-02-18 ENCOUNTER — Emergency Department (HOSPITAL_BASED_OUTPATIENT_CLINIC_OR_DEPARTMENT_OTHER): Payer: BC Managed Care – PPO

## 2014-02-18 ENCOUNTER — Encounter (HOSPITAL_BASED_OUTPATIENT_CLINIC_OR_DEPARTMENT_OTHER): Payer: Self-pay | Admitting: Emergency Medicine

## 2014-02-18 ENCOUNTER — Emergency Department (HOSPITAL_BASED_OUTPATIENT_CLINIC_OR_DEPARTMENT_OTHER)
Admission: EM | Admit: 2014-02-18 | Discharge: 2014-02-18 | Disposition: A | Discharge status: Home / Self Care | Payer: BC Managed Care – PPO | Attending: Emergency Medicine | Admitting: Emergency Medicine

## 2014-02-18 DIAGNOSIS — Z720 Tobacco use: Secondary | ICD-10-CM | POA: Insufficient documentation

## 2014-02-18 DIAGNOSIS — F419 Anxiety disorder, unspecified: Secondary | ICD-10-CM | POA: Diagnosis not present

## 2014-02-18 DIAGNOSIS — E559 Vitamin D deficiency, unspecified: Secondary | ICD-10-CM | POA: Diagnosis not present

## 2014-02-18 DIAGNOSIS — F329 Major depressive disorder, single episode, unspecified: Secondary | ICD-10-CM | POA: Diagnosis not present

## 2014-02-18 DIAGNOSIS — M79641 Pain in right hand: Secondary | ICD-10-CM | POA: Insufficient documentation

## 2014-02-18 DIAGNOSIS — I10 Essential (primary) hypertension: Secondary | ICD-10-CM | POA: Insufficient documentation

## 2014-02-18 DIAGNOSIS — E079 Disorder of thyroid, unspecified: Secondary | ICD-10-CM | POA: Diagnosis not present

## 2014-02-18 DIAGNOSIS — Z79899 Other long term (current) drug therapy: Secondary | ICD-10-CM | POA: Diagnosis not present

## 2014-02-18 DIAGNOSIS — Z880 Allergy status to penicillin: Secondary | ICD-10-CM | POA: Diagnosis not present

## 2014-02-18 HISTORY — DX: Disorder of thyroid, unspecified: E07.9

## 2014-02-18 MED ORDER — NAPROXEN 500 MG PO TABS: 500.0000 mg | ORAL_TABLET | Freq: Two times a day (BID) | ORAL | Status: DC

## 2014-02-18 NOTE — Discharge Instructions (Signed)
Please read and follow all provided instructions.  Your diagnoses today include:  1. 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 See below for your results today.   Medications prescribed:   Naproxen - anti-inflammatory pain medication  Do not exceed 500mg  naproxen every 12 hours, take with food  You have been prescribed an anti-inflammatory medication or NSAID. Take with food. Take smallest effective dose for the shortest duration needed for your pain. Stop taking if you experience stomach pain or vomiting.   Take any prescribed medications only as directed.  Home care instructions:   Follow any educational materials contained in this packet  Follow R.I.C.E. Protocol:  R - rest your injury   I  - use ice on injury without applying directly to skin  C - compress injury with bandage or splint  E - elevate the injury as much as possible  Follow-up instructions: Please follow-up with your primary care provider or the provided orthopedic physician (bone specialist) if you continue to have significant pain in 1 week. In this case you may have a severe injury that requires further care.   Return instructions:   Please return if your toes are numb or tingling, appear gray or blue, or you have severe pain (also elevate leg and loosen splint or wrap if you were given one)  Please return to the Emergency Department if you experience worsening symptoms.   Please return if you have any other emergent concerns.  Additional Information:  Your vital signs today were: BP 114/72   Pulse 76   Temp(Src) 98.2 F (36.8 C) (Oral)   Resp 18   Ht 4\' 11"  (1.499 m)   Wt 144 lb (65.318 kg)   BMI 29.07 kg/m2   SpO2 98%   LMP 09/25/2010 If your blood pressure (BP) was elevated above 135/85 this visit, please have this repeated by your doctor  within one month.

## 2014-02-18 NOTE — ED Provider Notes (Signed)
CSN: [id number]     Arrival date & time 02/18/14  1535 History   First MD Initiated Contact with Patient 02/18/14 1657     Chief Complaint  Patient presents with  . 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 Patient began having minor pain in her right hand at base of her thumb and index finger. Pain gradually became worse. It was severe yesterday and today. Patient took naproxen with some relief. She denies any acute injury but does a lot of lifting and typing at work. She reports mild swelling. No redness. No fever. No history of rheumatoid arthritis, gout, or other types of arthritis. Patient denies joint pains in other areas. She does not have an orthopedic physician. No fever. No numbness or tingling. Patient has felt some clicking and popping when she moves her thumb.  Patient is a 58 y.o. female presenting with hand pain. The history is provided by the patient.  Hand Pain Associated symptoms include arthralgias and joint swelling. Pertinent negatives include no neck pain, numbness or weakness.    Past Medical History  Diagnosis Date  . Allergy     pollen  . Depression   . Hypertension   . Vitamin D deficiency   . Anxiety   . Thyroid disease    Past Surgical History  Procedure Laterality Date  . Back surgery    . Tubal ligation    . Sigmoidoscopy    . Hemorrhoid surgery      banding  . Laparoscopic assisted vaginal hysterectomy N/A 11/13/2013    Procedure: LAPAROSCOPIC ASSISTED VAGINAL HYSTERECTOMY WITH BILATERAL SALPINGO OOPHORECTOMY;  Surgeon: Cyril Mourning, MD;  Location: [facility];  Service: Gynecology;  Laterality: N/A;  . Anterior and posterior repair N/A 11/13/2013    Procedure: POSTERIOR REPAIR (RECTOCELE), ;  Surgeon: Cyril Mourning, MD;  Location: [facility];  Service: Gynecology;  Laterality: N/A;   Family  History  Problem Relation Age of Onset  . Hypertension Mother   . COPD Mother   . Alzheimer's disease Mother   . Heart disease Father   . Cancer Father     Prostate  . Thyroid disease Father    History  Substance Use Topics  . Smoking status: Former Smoker -- 0.80 packs/day    Types: Cigarettes    Quit date: 02/07/2013  . Smokeless tobacco: Never Used  . Alcohol Use: No     Comment: Rarely   OB History   Grav Para Term Preterm Abortions TAB SAB Ect Mult Living                 Review of Systems  Constitutional: Positive for activity change.  Musculoskeletal: Positive for arthralgias and joint swelling. Negative for back pain and neck pain.  Skin: Negative for wound.  Neurological: Negative for weakness and numbness.      Allergies  Doxycycline; Sulfa antibiotics; Vicodin; Erythromycin; Penicillins; and Tetracyclines & related  Home Medications   Prior to Admission medications   Medication Sig Start Date End Date Taking? Authorizing Provider  ALPRAZolam Duanne Moron) 1 MG tablet Take 1 mg by mouth at bedtime.    Historical Provider, MD  azelastine (ASTELIN) 0.1 % nasal spray Place into both nostrils 2 (two) times daily. Use in each nostril as directed    Historical Provider, MD  Cholecalciferol (VITAMIN D) 2000 UNITS tablet Take 4,000 Units by mouth daily.  Takes 4000 units daily    Historical Provider, MD  ibuprofen (ADVIL,MOTRIN) 600 MG tablet Take 1 tablet (600 mg total) by mouth every 6 (six) hours as needed (mild pain). 11/14/13   Cyril Mourning, MD  KLOR-CON M20 20 MEQ tablet TAKE 1 TABLET (20 MEQ TOTAL) BY MOUTH ONCE. 01/16/14   Unk Pinto, MD  levothyroxine (SYNTHROID, LEVOTHROID) 75 MCG tablet TAKE 1 TABLET (75 MCG TOTAL) BY MOUTH DAILY BEFORE BREAKFAST. 01/15/14   Vicie Mutters, PA-C  methocarbamol (ROBAXIN) 500 MG tablet Take 500 mg by mouth 2 (two) times daily.    Historical Provider, MD  Multiple Vitamin (STRESS B PO) Take by mouth daily.    Historical Provider,  MD  naproxen (NAPROSYN) 500 MG tablet Take 1 tablet (500 mg total) by mouth 2 (two) times daily. 02/18/14   Carlisle Cater, PA-C  naproxen sodium (ANAPROX) 220 MG tablet Take 440 mg by mouth as needed. pain     Historical Provider, MD  omeprazole (PRILOSEC) 20 MG capsule Take 20 mg by mouth daily.    Historical Provider, MD  oxyCODONE-acetaminophen (PERCOCET/ROXICET) 5-325 MG per tablet Take 1-2 tablets by mouth every 4 (four) hours as needed for severe pain. 11/14/13   Cyril Mourning, MD  phenylephrine (SUDAFED PE) 10 MG TABS tablet Take 10 mg by mouth every 4 (four) hours as needed.    Historical Provider, MD  polyethylene glycol powder (GLYCOLAX/MIRALAX) powder Take 1 Container by mouth once.    Historical Provider, MD  Saline 0.2 % SOLN Place into the nose.    Historical Provider, MD  Sennosides (SENOKOT PO) Take by mouth. Takes 2 at bedtime    Historical Provider, MD  sertraline (ZOLOFT) 50 MG tablet Take 50 mg by mouth at bedtime.     Historical Provider, MD  traMADol (ULTRAM) 50 MG tablet Take 1 tablet (50 mg total) by mouth every 6 (six) hours as needed. 12/25/13 12/25/14  Vicie Mutters, PA-C  triamterene-hydrochlorothiazide (MAXZIDE) 75-50 MG per tablet TAKE 1 TABLET EVERY MORNING FOR BP AND FLUID 01/30/14   Unk Pinto, MD   BP 114/72  Pulse 76  Temp(Src) 98.2 F (36.8 C) (Oral)  Resp 18  Ht 4\' 11"  (1.499 m)  Wt 144 lb (65.318 kg)  BMI 29.07 kg/m2  SpO2 98%  LMP 09/25/2010 Physical Exam  Nursing note and vitals reviewed. Constitutional: She appears well-developed and well-nourished.  HENT:  Head: Normocephalic and atraumatic.  Eyes: Pupils are equal, round, and reactive to light.  Neck: Normal range of motion. Neck supple.  Cardiovascular: Exam reveals no decreased pulses.   Pulses:      Radial pulses are 2+ on the right side, and 2+ on the left side.  Musculoskeletal: She exhibits tenderness. She exhibits no edema.       Right shoulder: Normal.       Right elbow:  Normal.      Right wrist: Normal.       Right hand: She exhibits tenderness. She exhibits normal range of motion, no bony tenderness, normal capillary refill and no deformity. Normal sensation noted. Decreased strength noted. She exhibits thumb/finger opposition.       Hands: Neurological: She is alert. No sensory deficit.  Motor, sensation, and vascular distal to the injury is fully intact.   Skin: Skin is warm and dry.  Psychiatric: She has a normal mood and affect.    ED Course  Procedures (including critical care time) Labs Review Labs Reviewed - No data to display  Imaging Review Dg Hand Complete Right  02/18/2014   CLINICAL DATA:  58 year old female with acute pain at the right first and second metacarpal area. No known injury. Initial encounter.  EXAM: RIGHT HAND - COMPLETE 3+ VIEW  COMPARISON:  None.  FINDINGS: Bone mineralization is within normal limits. Distal radius and ulna intact. Proximal carpal bone alignment within normal limits.  Very severe joint space loss at the right thumb CMC joint, with subchondral sclerosis, bulky osteophytosis, and periosteal reaction. No associated fracture or dislocation, but there is some radial subluxation of the first metacarpal.  There is mild to moderate secondary degenerative change along the radial margin of the second CMC joint. Other joint spaces in the right hand are within normal limits for age. No acute fracture or dislocation identified.  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 No acute osseous abnormality identified.   Electronically Signed   By: Lars Pinks M.D.   On: 02/18/2014 16:10     EKG Interpretation None      5:45 PM Patient seen and examined.    Vital signs reviewed and are as follows: BP 114/72  Pulse 76  Temp(Src) 98.2 F (36.8 C) (Oral)  Resp 18  Ht 4\' 11"  (1.499 m)  Wt 144 lb (65.318 kg)  BMI 29.07 kg/m2  SpO2 98%  LMP 09/25/2010  Will immobilize with velcro thumb spica. Will  continue NSAIDs. Will use RICE protocol. Ortho/PCP f/u encouraged.     MDM   Final diagnoses:  Pain of right hand   Patient with right hand pain likely secondary to osteoarthritis seen on x-ray. There is no redness or swelling, fever to suggest septic arthritis or inflammatory arthritis. No signs of cellulitis. Conservative measures indicated with followup as needed.    Carlisle Cater, PA-C 02/18/14 1747

## 2014-02-18 NOTE — ED Notes (Signed)
Pain in her right hand. Pain is worse in her thumb.

## 2014-02-19 NOTE — ED Provider Notes (Signed)
Medical screening examination/treatment/procedure(s) were performed by non-physician practitioner and as supervising physician I was immediately available for consultation/collaboration.   EKG Interpretation None       Charlesetta Shanks, MD 02/19/14 [phone]

## 2014-03-04 ENCOUNTER — Institutional Professional Consult (permissible substitution): Payer: BC Managed Care – PPO | Admitting: Internal Medicine

## 2014-04-03 ENCOUNTER — Ambulatory Visit: Payer: Self-pay | Admitting: Internal Medicine

## 2015-12-03 DIAGNOSIS — E039 Hypothyroidism, unspecified: Secondary | ICD-10-CM | POA: Diagnosis not present

## 2015-12-05 DIAGNOSIS — M25551 Pain in right hip: Secondary | ICD-10-CM | POA: Diagnosis not present

## 2015-12-05 DIAGNOSIS — M1612 Unilateral primary osteoarthritis, left hip: Secondary | ICD-10-CM | POA: Diagnosis not present

## 2015-12-09 DIAGNOSIS — I10 Essential (primary) hypertension: Secondary | ICD-10-CM | POA: Diagnosis not present

## 2015-12-09 DIAGNOSIS — M25552 Pain in left hip: Secondary | ICD-10-CM | POA: Diagnosis not present

## 2015-12-09 DIAGNOSIS — Z6830 Body mass index (BMI) 30.0-30.9, adult: Secondary | ICD-10-CM | POA: Diagnosis not present

## 2015-12-09 DIAGNOSIS — E039 Hypothyroidism, unspecified: Secondary | ICD-10-CM | POA: Diagnosis not present

## 2016-01-08 DIAGNOSIS — Z23 Encounter for immunization: Secondary | ICD-10-CM | POA: Diagnosis not present

## 2016-03-17 IMAGING — US US SOFT TISSUE HEAD/NECK
1 series · 14 of 25 positions shown · non-contrast
Comparison: None.

CLINICAL DATA: Thyroid nodule

EXAM:
THYROID ULTRASOUND
TECHNIQUE: Ultrasound examination of the thyroid gland and adjacent soft
tissues was performed.

[Series 1: us soft tissue head/neck · 0.05mm/px · 14 of 40 slices shown]
[im 1/40]
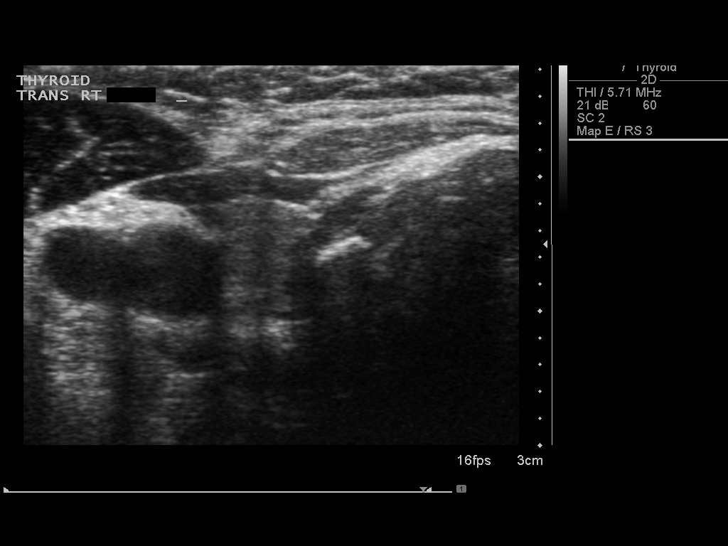
[im 4/40]
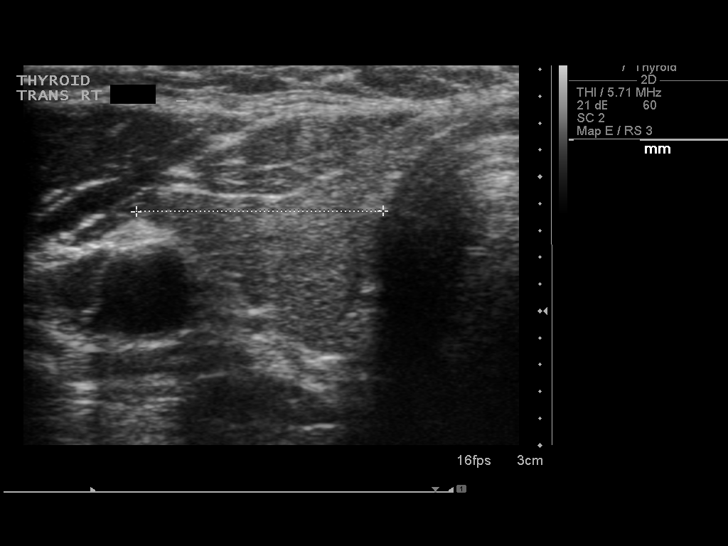
[im 7/40]
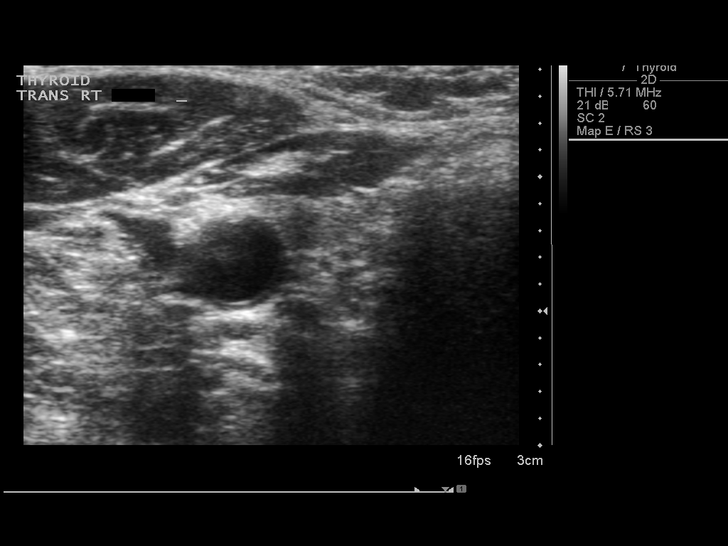
[im 10/40]
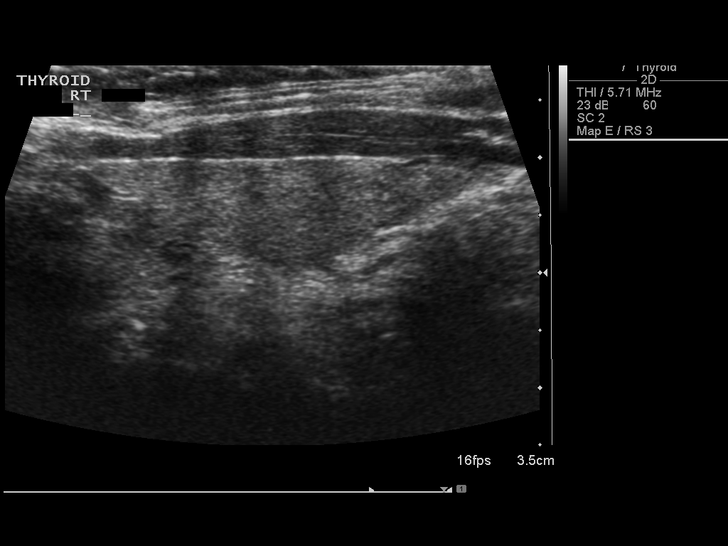
[im 14/40]
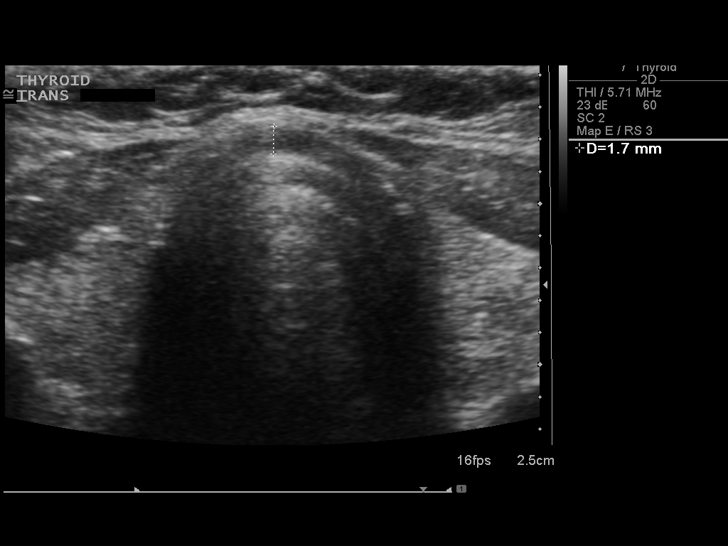
[im 15/40]
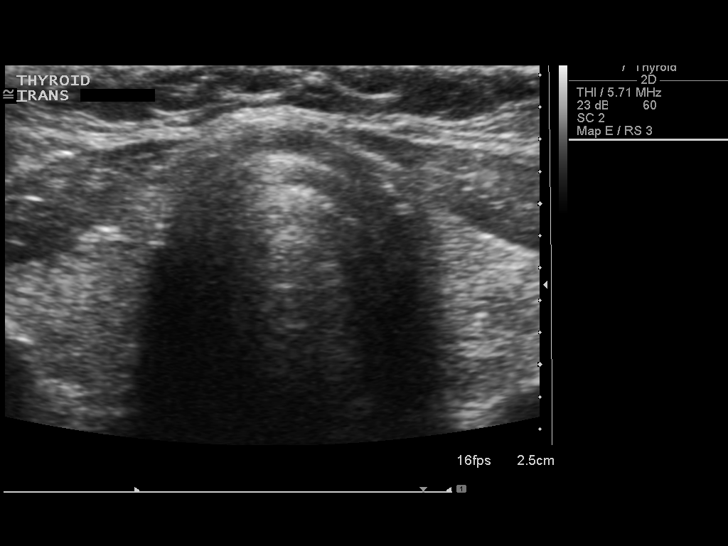
[im 18/40]
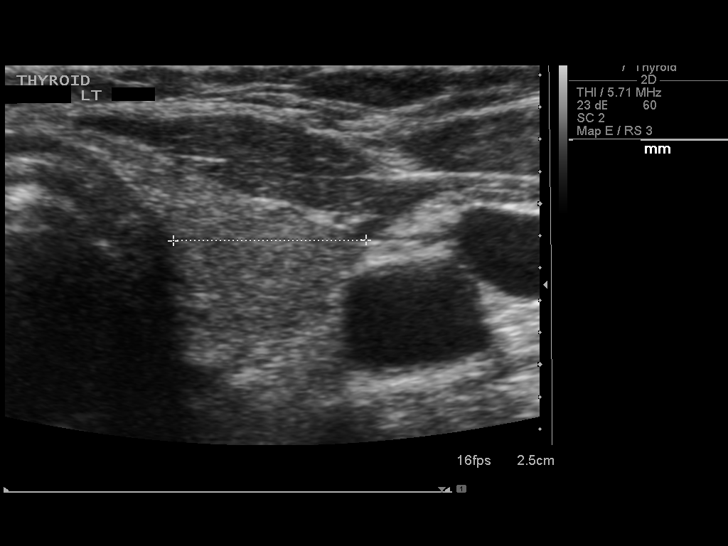
[im 22/40]
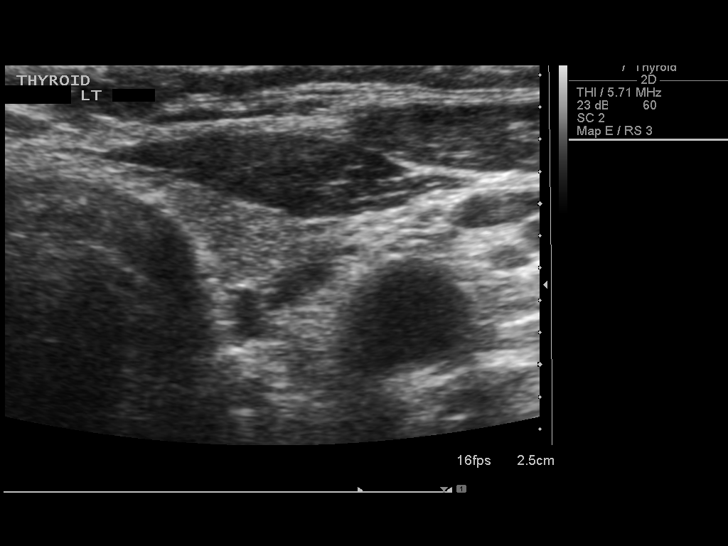
[im 25/40]
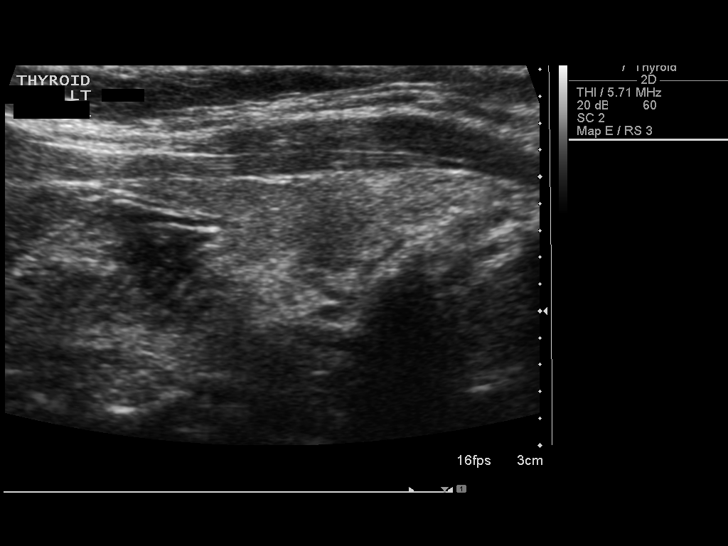
[im 27/40]
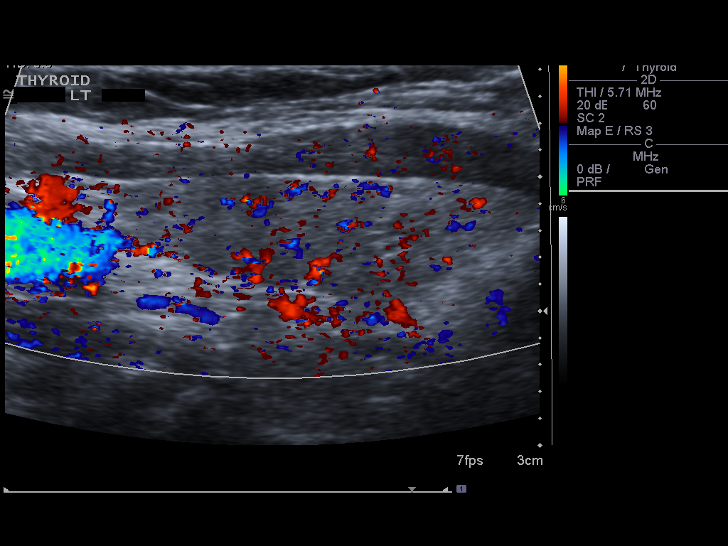
[im 30/40]
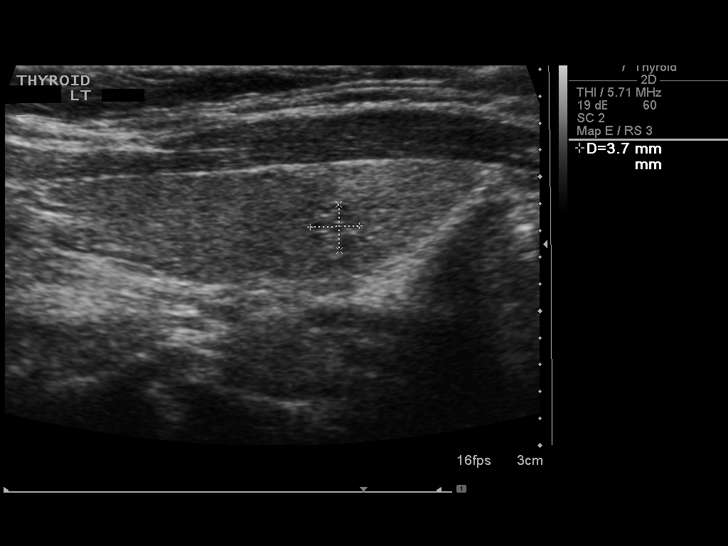
[im 33/40]
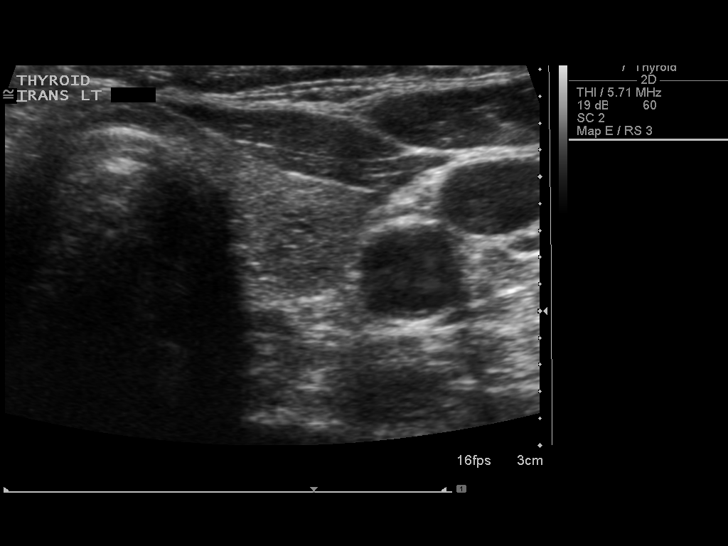
[im 36/40]
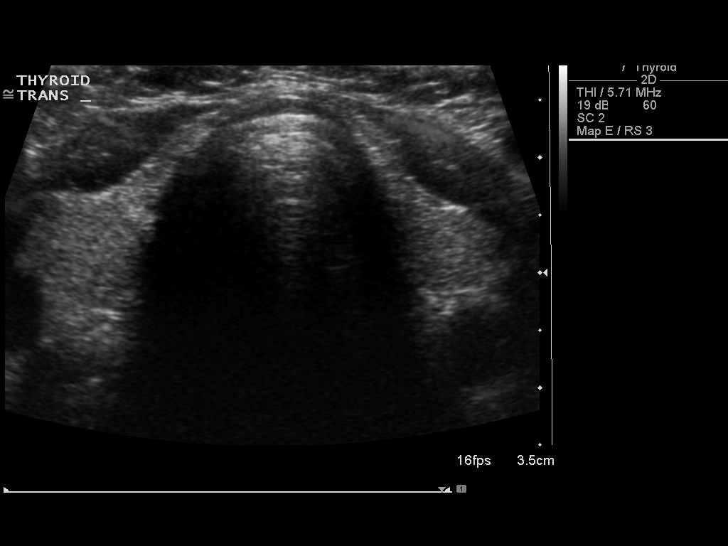
[im 40/40]
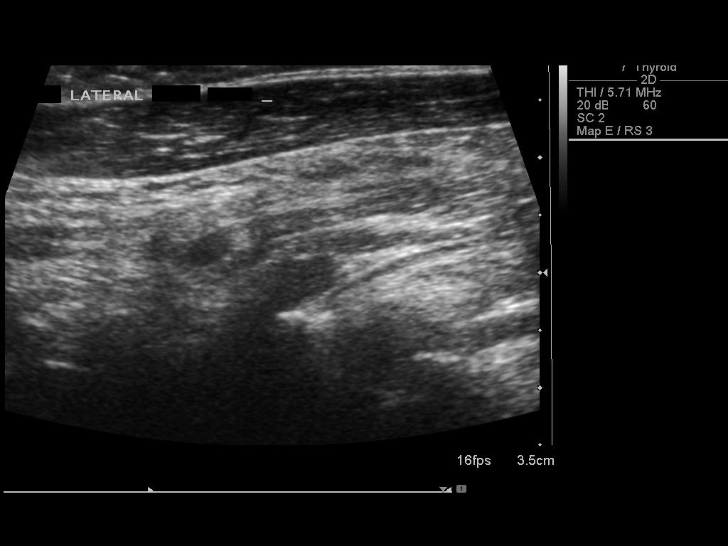

[14 of 25 positions shown; findings below may reference images not displayed]

FINDINGS: Right thyroid lobe

Measurements: 44 x 14 x 18 mm.  No nodules visualized.

Left thyroid lobe

Measurements: 34 x 8 x 12 mm. Solitary 4 x 3 x 4 mm nodule, inferior
pole.

Isthmus

Thickness: 2 mm.  No nodules visualized.

Lymphadenopathy

None visualized.
IMPRESSION: 1. Normal-sized thyroid with a single small left nodule. Findings do
not meet current consensus criteria for biopsy. Follow-up by
clinical exam is recommended. If patient has known risk factors for
thyroid carcinoma, consider follow-up ultrasound in 12 months. If
patient is clinically hyperthyroid, consider nuclear medicine
thyroid uptake and scan. This recommendation follows the consensus
statement: Management of Thyroid Nodules Detected as US: Society of
Radiologists in Ultrasound Consensus Conference Statement. Radiology

## 2016-05-13 DIAGNOSIS — J1089 Influenza due to other identified influenza virus with other manifestations: Secondary | ICD-10-CM | POA: Diagnosis not present

## 2016-05-21 DIAGNOSIS — Z6827 Body mass index (BMI) 27.0-27.9, adult: Secondary | ICD-10-CM | POA: Diagnosis not present

## 2016-05-21 DIAGNOSIS — Z01419 Encounter for gynecological examination (general) (routine) without abnormal findings: Secondary | ICD-10-CM | POA: Diagnosis not present

## 2016-05-21 DIAGNOSIS — Z1231 Encounter for screening mammogram for malignant neoplasm of breast: Secondary | ICD-10-CM | POA: Diagnosis not present

## 2016-05-21 DIAGNOSIS — Z1212 Encounter for screening for malignant neoplasm of rectum: Secondary | ICD-10-CM | POA: Diagnosis not present

## 2016-06-09 DIAGNOSIS — Z136 Encounter for screening for cardiovascular disorders: Secondary | ICD-10-CM | POA: Diagnosis not present

## 2016-06-09 DIAGNOSIS — Z0000 Encounter for general adult medical examination without abnormal findings: Secondary | ICD-10-CM | POA: Diagnosis not present

## 2016-06-10 DIAGNOSIS — H6122 Impacted cerumen, left ear: Secondary | ICD-10-CM | POA: Diagnosis not present

## 2016-06-10 DIAGNOSIS — Z1211 Encounter for screening for malignant neoplasm of colon: Secondary | ICD-10-CM | POA: Diagnosis not present

## 2016-06-10 DIAGNOSIS — Z23 Encounter for immunization: Secondary | ICD-10-CM | POA: Diagnosis not present

## 2016-06-10 DIAGNOSIS — Z0000 Encounter for general adult medical examination without abnormal findings: Secondary | ICD-10-CM | POA: Diagnosis not present

## 2016-06-10 DIAGNOSIS — Z6828 Body mass index (BMI) 28.0-28.9, adult: Secondary | ICD-10-CM | POA: Diagnosis not present

## 2016-07-23 DIAGNOSIS — J029 Acute pharyngitis, unspecified: Secondary | ICD-10-CM | POA: Diagnosis not present

## 2016-07-23 DIAGNOSIS — J069 Acute upper respiratory infection, unspecified: Secondary | ICD-10-CM | POA: Diagnosis not present

## 2016-07-30 DIAGNOSIS — Z6829 Body mass index (BMI) 29.0-29.9, adult: Secondary | ICD-10-CM | POA: Diagnosis not present

## 2016-07-30 DIAGNOSIS — Z8481 Family history of carrier of genetic disease: Secondary | ICD-10-CM | POA: Diagnosis not present

## 2016-12-27 DIAGNOSIS — E785 Hyperlipidemia, unspecified: Secondary | ICD-10-CM | POA: Diagnosis not present

## 2016-12-27 DIAGNOSIS — E039 Hypothyroidism, unspecified: Secondary | ICD-10-CM | POA: Diagnosis not present

## 2016-12-31 DIAGNOSIS — E039 Hypothyroidism, unspecified: Secondary | ICD-10-CM | POA: Diagnosis not present

## 2016-12-31 DIAGNOSIS — N390 Urinary tract infection, site not specified: Secondary | ICD-10-CM | POA: Diagnosis not present

## 2016-12-31 DIAGNOSIS — Z6829 Body mass index (BMI) 29.0-29.9, adult: Secondary | ICD-10-CM | POA: Diagnosis not present

## 2016-12-31 DIAGNOSIS — F411 Generalized anxiety disorder: Secondary | ICD-10-CM | POA: Diagnosis not present

## 2016-12-31 DIAGNOSIS — B009 Herpesviral infection, unspecified: Secondary | ICD-10-CM | POA: Diagnosis not present

## 2017-02-08 DIAGNOSIS — Z23 Encounter for immunization: Secondary | ICD-10-CM | POA: Diagnosis not present

## 2017-02-28 DIAGNOSIS — Z23 Encounter for immunization: Secondary | ICD-10-CM | POA: Diagnosis not present

## 2017-02-28 DIAGNOSIS — E782 Mixed hyperlipidemia: Secondary | ICD-10-CM | POA: Diagnosis not present

## 2017-02-28 DIAGNOSIS — Z6830 Body mass index (BMI) 30.0-30.9, adult: Secondary | ICD-10-CM | POA: Diagnosis not present

## 2017-02-28 DIAGNOSIS — Z13220 Encounter for screening for lipoid disorders: Secondary | ICD-10-CM | POA: Diagnosis not present

## 2017-02-28 DIAGNOSIS — R319 Hematuria, unspecified: Secondary | ICD-10-CM | POA: Diagnosis not present

## 2017-02-28 DIAGNOSIS — I10 Essential (primary) hypertension: Secondary | ICD-10-CM | POA: Diagnosis not present

## 2017-03-21 DIAGNOSIS — R3121 Asymptomatic microscopic hematuria: Secondary | ICD-10-CM | POA: Diagnosis not present

## 2017-04-04 DIAGNOSIS — Z23 Encounter for immunization: Secondary | ICD-10-CM | POA: Diagnosis not present

## 2017-04-08 DIAGNOSIS — R3121 Asymptomatic microscopic hematuria: Secondary | ICD-10-CM | POA: Diagnosis not present

## 2017-05-02 DIAGNOSIS — J209 Acute bronchitis, unspecified: Secondary | ICD-10-CM | POA: Diagnosis not present

## 2017-05-02 DIAGNOSIS — R062 Wheezing: Secondary | ICD-10-CM | POA: Diagnosis not present

## 2017-05-09 DIAGNOSIS — J069 Acute upper respiratory infection, unspecified: Secondary | ICD-10-CM | POA: Diagnosis not present

## 2017-05-09 DIAGNOSIS — E039 Hypothyroidism, unspecified: Secondary | ICD-10-CM | POA: Diagnosis not present

## 2017-05-09 DIAGNOSIS — J209 Acute bronchitis, unspecified: Secondary | ICD-10-CM | POA: Diagnosis not present

## 2017-05-09 DIAGNOSIS — F411 Generalized anxiety disorder: Secondary | ICD-10-CM | POA: Diagnosis not present

## 2017-05-11 DIAGNOSIS — R0902 Hypoxemia: Secondary | ICD-10-CM | POA: Diagnosis not present

## 2017-05-11 DIAGNOSIS — J209 Acute bronchitis, unspecified: Secondary | ICD-10-CM | POA: Diagnosis not present

## 2017-05-11 DIAGNOSIS — J069 Acute upper respiratory infection, unspecified: Secondary | ICD-10-CM | POA: Diagnosis not present

## 2017-05-11 DIAGNOSIS — Z6829 Body mass index (BMI) 29.0-29.9, adult: Secondary | ICD-10-CM | POA: Diagnosis not present

## 2017-05-11 DIAGNOSIS — J189 Pneumonia, unspecified organism: Secondary | ICD-10-CM | POA: Diagnosis not present

## 2017-05-11 DIAGNOSIS — I10 Essential (primary) hypertension: Secondary | ICD-10-CM | POA: Diagnosis not present

## 2017-05-16 DIAGNOSIS — Z6829 Body mass index (BMI) 29.0-29.9, adult: Secondary | ICD-10-CM | POA: Diagnosis not present

## 2017-05-16 DIAGNOSIS — J45909 Unspecified asthma, uncomplicated: Secondary | ICD-10-CM | POA: Diagnosis not present

## 2017-05-16 DIAGNOSIS — R3121 Asymptomatic microscopic hematuria: Secondary | ICD-10-CM | POA: Diagnosis not present

## 2017-05-16 DIAGNOSIS — J209 Acute bronchitis, unspecified: Secondary | ICD-10-CM | POA: Diagnosis not present

## 2017-05-16 DIAGNOSIS — J189 Pneumonia, unspecified organism: Secondary | ICD-10-CM | POA: Diagnosis not present

## 2017-05-31 DIAGNOSIS — Z6829 Body mass index (BMI) 29.0-29.9, adult: Secondary | ICD-10-CM | POA: Diagnosis not present

## 2017-05-31 DIAGNOSIS — I10 Essential (primary) hypertension: Secondary | ICD-10-CM | POA: Diagnosis not present

## 2017-05-31 DIAGNOSIS — J45909 Unspecified asthma, uncomplicated: Secondary | ICD-10-CM | POA: Diagnosis not present

## 2017-05-31 DIAGNOSIS — J189 Pneumonia, unspecified organism: Secondary | ICD-10-CM | POA: Diagnosis not present

## 2017-05-31 DIAGNOSIS — J069 Acute upper respiratory infection, unspecified: Secondary | ICD-10-CM | POA: Diagnosis not present

## 2017-06-06 DIAGNOSIS — R112 Nausea with vomiting, unspecified: Secondary | ICD-10-CM | POA: Diagnosis not present

## 2017-06-06 DIAGNOSIS — R1083 Colic: Secondary | ICD-10-CM | POA: Diagnosis not present

## 2017-06-06 DIAGNOSIS — R3129 Other microscopic hematuria: Secondary | ICD-10-CM | POA: Diagnosis not present

## 2017-06-06 DIAGNOSIS — R197 Diarrhea, unspecified: Secondary | ICD-10-CM | POA: Diagnosis not present

## 2017-06-17 DIAGNOSIS — Z01419 Encounter for gynecological examination (general) (routine) without abnormal findings: Secondary | ICD-10-CM | POA: Diagnosis not present

## 2017-06-17 DIAGNOSIS — Z13820 Encounter for screening for osteoporosis: Secondary | ICD-10-CM | POA: Diagnosis not present

## 2017-06-17 DIAGNOSIS — Z6828 Body mass index (BMI) 28.0-28.9, adult: Secondary | ICD-10-CM | POA: Diagnosis not present

## 2017-06-17 DIAGNOSIS — Z1231 Encounter for screening mammogram for malignant neoplasm of breast: Secondary | ICD-10-CM | POA: Diagnosis not present

## 2017-06-20 DIAGNOSIS — E559 Vitamin D deficiency, unspecified: Secondary | ICD-10-CM | POA: Diagnosis not present

## 2017-06-20 DIAGNOSIS — Z1329 Encounter for screening for other suspected endocrine disorder: Secondary | ICD-10-CM | POA: Diagnosis not present

## 2017-06-20 DIAGNOSIS — Z13220 Encounter for screening for lipoid disorders: Secondary | ICD-10-CM | POA: Diagnosis not present

## 2017-06-20 DIAGNOSIS — Z114 Encounter for screening for human immunodeficiency virus [HIV]: Secondary | ICD-10-CM | POA: Diagnosis not present

## 2017-06-20 DIAGNOSIS — Z0000 Encounter for general adult medical examination without abnormal findings: Secondary | ICD-10-CM | POA: Diagnosis not present

## 2017-07-01 ENCOUNTER — Other Ambulatory Visit: Payer: Self-pay | Admitting: Family Medicine

## 2017-07-01 DIAGNOSIS — R1013 Epigastric pain: Secondary | ICD-10-CM

## 2017-07-01 DIAGNOSIS — E039 Hypothyroidism, unspecified: Secondary | ICD-10-CM | POA: Diagnosis not present

## 2017-07-01 DIAGNOSIS — Z1211 Encounter for screening for malignant neoplasm of colon: Secondary | ICD-10-CM | POA: Diagnosis not present

## 2017-07-01 DIAGNOSIS — J309 Allergic rhinitis, unspecified: Secondary | ICD-10-CM | POA: Diagnosis not present

## 2017-07-01 DIAGNOSIS — E559 Vitamin D deficiency, unspecified: Secondary | ICD-10-CM | POA: Diagnosis not present

## 2017-07-01 DIAGNOSIS — Z0000 Encounter for general adult medical examination without abnormal findings: Secondary | ICD-10-CM | POA: Diagnosis not present

## 2017-07-01 DIAGNOSIS — Z6828 Body mass index (BMI) 28.0-28.9, adult: Secondary | ICD-10-CM | POA: Diagnosis not present

## 2017-07-11 DIAGNOSIS — R945 Abnormal results of liver function studies: Secondary | ICD-10-CM | POA: Diagnosis not present

## 2017-07-15 ENCOUNTER — Ambulatory Visit
Admission: RE | Admit: 2017-07-15 | Discharge: 2017-07-15 | Disposition: A | Discharge status: Home / Self Care | Payer: Self-pay | Source: Ambulatory Visit | Attending: Family Medicine | Admitting: Family Medicine

## 2017-07-15 DIAGNOSIS — R1013 Epigastric pain: Secondary | ICD-10-CM

## 2017-07-15 DIAGNOSIS — K8020 Calculus of gallbladder without cholecystitis without obstruction: Secondary | ICD-10-CM | POA: Diagnosis not present

## 2017-07-18 DIAGNOSIS — K8020 Calculus of gallbladder without cholecystitis without obstruction: Secondary | ICD-10-CM | POA: Diagnosis not present

## 2017-07-18 DIAGNOSIS — R945 Abnormal results of liver function studies: Secondary | ICD-10-CM | POA: Diagnosis not present

## 2017-07-18 DIAGNOSIS — M1990 Unspecified osteoarthritis, unspecified site: Secondary | ICD-10-CM | POA: Diagnosis not present

## 2017-07-18 DIAGNOSIS — Z6829 Body mass index (BMI) 29.0-29.9, adult: Secondary | ICD-10-CM | POA: Diagnosis not present

## 2017-07-28 DIAGNOSIS — J101 Influenza due to other identified influenza virus with other respiratory manifestations: Secondary | ICD-10-CM | POA: Diagnosis not present

## 2017-08-03 DIAGNOSIS — J020 Streptococcal pharyngitis: Secondary | ICD-10-CM | POA: Diagnosis not present

## 2017-08-19 DIAGNOSIS — K8020 Calculus of gallbladder without cholecystitis without obstruction: Secondary | ICD-10-CM | POA: Diagnosis not present

## 2017-09-09 DIAGNOSIS — M25552 Pain in left hip: Secondary | ICD-10-CM | POA: Diagnosis not present

## 2017-09-09 DIAGNOSIS — M1612 Unilateral primary osteoarthritis, left hip: Secondary | ICD-10-CM | POA: Diagnosis not present

## 2017-09-16 ENCOUNTER — Ambulatory Visit: Payer: Self-pay | Admitting: Allergy

## 2017-09-21 DIAGNOSIS — M1612 Unilateral primary osteoarthritis, left hip: Secondary | ICD-10-CM | POA: Diagnosis not present

## 2017-10-09 DIAGNOSIS — J019 Acute sinusitis, unspecified: Secondary | ICD-10-CM | POA: Diagnosis not present

## 2017-10-14 DIAGNOSIS — Z01818 Encounter for other preprocedural examination: Secondary | ICD-10-CM | POA: Diagnosis not present

## 2017-10-18 ENCOUNTER — Other Ambulatory Visit: Payer: Self-pay | Admitting: General Surgery

## 2017-10-18 DIAGNOSIS — K8020 Calculus of gallbladder without cholecystitis without obstruction: Secondary | ICD-10-CM | POA: Diagnosis not present

## 2017-11-14 DIAGNOSIS — M1612 Unilateral primary osteoarthritis, left hip: Secondary | ICD-10-CM | POA: Diagnosis not present

## 2018-01-16 DIAGNOSIS — R945 Abnormal results of liver function studies: Secondary | ICD-10-CM | POA: Diagnosis not present

## 2018-01-16 DIAGNOSIS — I10 Essential (primary) hypertension: Secondary | ICD-10-CM | POA: Diagnosis not present

## 2018-01-16 DIAGNOSIS — E039 Hypothyroidism, unspecified: Secondary | ICD-10-CM | POA: Diagnosis not present

## 2018-01-18 DIAGNOSIS — K219 Gastro-esophageal reflux disease without esophagitis: Secondary | ICD-10-CM | POA: Diagnosis not present

## 2018-01-18 DIAGNOSIS — E039 Hypothyroidism, unspecified: Secondary | ICD-10-CM | POA: Diagnosis not present

## 2018-01-18 DIAGNOSIS — Z23 Encounter for immunization: Secondary | ICD-10-CM | POA: Diagnosis not present

## 2018-01-18 DIAGNOSIS — R5383 Other fatigue: Secondary | ICD-10-CM | POA: Diagnosis not present

## 2018-01-18 DIAGNOSIS — N390 Urinary tract infection, site not specified: Secondary | ICD-10-CM | POA: Diagnosis not present

## 2018-02-17 DIAGNOSIS — I10 Essential (primary) hypertension: Secondary | ICD-10-CM | POA: Diagnosis not present

## 2018-02-17 DIAGNOSIS — K219 Gastro-esophageal reflux disease without esophagitis: Secondary | ICD-10-CM | POA: Diagnosis not present

## 2018-02-17 DIAGNOSIS — Z6827 Body mass index (BMI) 27.0-27.9, adult: Secondary | ICD-10-CM | POA: Diagnosis not present

## 2018-02-17 DIAGNOSIS — R319 Hematuria, unspecified: Secondary | ICD-10-CM | POA: Diagnosis not present

## 2018-04-24 DIAGNOSIS — J1089 Influenza due to other identified influenza virus with other manifestations: Secondary | ICD-10-CM | POA: Diagnosis not present

## 2018-04-24 DIAGNOSIS — R05 Cough: Secondary | ICD-10-CM | POA: Diagnosis not present

## 2018-04-24 DIAGNOSIS — J0100 Acute maxillary sinusitis, unspecified: Secondary | ICD-10-CM | POA: Diagnosis not present

## 2018-04-27 DIAGNOSIS — H109 Unspecified conjunctivitis: Secondary | ICD-10-CM | POA: Diagnosis not present

## 2018-05-08 DIAGNOSIS — J019 Acute sinusitis, unspecified: Secondary | ICD-10-CM | POA: Diagnosis not present

## 2018-05-08 DIAGNOSIS — H109 Unspecified conjunctivitis: Secondary | ICD-10-CM | POA: Diagnosis not present

## 2018-06-07 DIAGNOSIS — R3129 Other microscopic hematuria: Secondary | ICD-10-CM | POA: Diagnosis not present

## 2018-06-07 DIAGNOSIS — K5900 Constipation, unspecified: Secondary | ICD-10-CM | POA: Diagnosis not present

## 2018-06-07 DIAGNOSIS — J069 Acute upper respiratory infection, unspecified: Secondary | ICD-10-CM | POA: Diagnosis not present

## 2018-08-15 DIAGNOSIS — Z01419 Encounter for gynecological examination (general) (routine) without abnormal findings: Secondary | ICD-10-CM | POA: Diagnosis not present

## 2018-08-15 DIAGNOSIS — Z1231 Encounter for screening mammogram for malignant neoplasm of breast: Secondary | ICD-10-CM | POA: Diagnosis not present

## 2018-08-17 DIAGNOSIS — Z8042 Family history of malignant neoplasm of prostate: Secondary | ICD-10-CM | POA: Diagnosis not present

## 2018-08-17 DIAGNOSIS — Z800 Family history of malignant neoplasm of digestive organs: Secondary | ICD-10-CM | POA: Diagnosis not present

## 2018-09-26 DIAGNOSIS — E669 Obesity, unspecified: Secondary | ICD-10-CM | POA: Diagnosis not present

## 2018-09-26 DIAGNOSIS — Z6830 Body mass index (BMI) 30.0-30.9, adult: Secondary | ICD-10-CM | POA: Diagnosis not present

## 2018-09-26 DIAGNOSIS — Z809 Family history of malignant neoplasm, unspecified: Secondary | ICD-10-CM | POA: Diagnosis not present

## 2018-09-28 DIAGNOSIS — M1612 Unilateral primary osteoarthritis, left hip: Secondary | ICD-10-CM | POA: Diagnosis not present

## 2018-11-16 DIAGNOSIS — E559 Vitamin D deficiency, unspecified: Secondary | ICD-10-CM | POA: Diagnosis not present

## 2018-11-16 DIAGNOSIS — Z0000 Encounter for general adult medical examination without abnormal findings: Secondary | ICD-10-CM | POA: Diagnosis not present

## 2018-11-21 DIAGNOSIS — Z1211 Encounter for screening for malignant neoplasm of colon: Secondary | ICD-10-CM | POA: Diagnosis not present

## 2018-11-21 DIAGNOSIS — Z0000 Encounter for general adult medical examination without abnormal findings: Secondary | ICD-10-CM | POA: Diagnosis not present

## 2018-11-21 DIAGNOSIS — Z23 Encounter for immunization: Secondary | ICD-10-CM | POA: Diagnosis not present

## 2018-11-21 DIAGNOSIS — Z6829 Body mass index (BMI) 29.0-29.9, adult: Secondary | ICD-10-CM | POA: Diagnosis not present

## 2018-12-05 DIAGNOSIS — E039 Hypothyroidism, unspecified: Secondary | ICD-10-CM | POA: Diagnosis not present

## 2018-12-05 DIAGNOSIS — I10 Essential (primary) hypertension: Secondary | ICD-10-CM | POA: Diagnosis not present

## 2018-12-05 DIAGNOSIS — E559 Vitamin D deficiency, unspecified: Secondary | ICD-10-CM | POA: Diagnosis not present

## 2018-12-05 DIAGNOSIS — Z9189 Other specified personal risk factors, not elsewhere classified: Secondary | ICD-10-CM | POA: Diagnosis not present

## 2019-01-23 DIAGNOSIS — Z23 Encounter for immunization: Secondary | ICD-10-CM | POA: Diagnosis not present

## 2019-02-08 DIAGNOSIS — Z23 Encounter for immunization: Secondary | ICD-10-CM | POA: Diagnosis not present

## 2019-02-14 DIAGNOSIS — M1612 Unilateral primary osteoarthritis, left hip: Secondary | ICD-10-CM | POA: Diagnosis not present

## 2019-02-27 DIAGNOSIS — R600 Localized edema: Secondary | ICD-10-CM | POA: Diagnosis not present

## 2019-02-27 DIAGNOSIS — R6889 Other general symptoms and signs: Secondary | ICD-10-CM | POA: Diagnosis not present

## 2019-02-27 DIAGNOSIS — M25552 Pain in left hip: Secondary | ICD-10-CM | POA: Diagnosis not present

## 2019-02-27 DIAGNOSIS — R252 Cramp and spasm: Secondary | ICD-10-CM | POA: Diagnosis not present

## 2019-02-28 ENCOUNTER — Other Ambulatory Visit: Payer: Self-pay | Admitting: Family Medicine

## 2019-02-28 DIAGNOSIS — R252 Cramp and spasm: Secondary | ICD-10-CM

## 2019-02-28 DIAGNOSIS — R600 Localized edema: Secondary | ICD-10-CM

## 2019-03-07 ENCOUNTER — Ambulatory Visit
Admission: RE | Admit: 2019-03-07 | Discharge: 2019-03-07 | Disposition: A | Discharge status: Home / Self Care | Payer: BC Managed Care – PPO | Source: Ambulatory Visit | Attending: Family Medicine | Admitting: Family Medicine

## 2019-03-07 DIAGNOSIS — R252 Cramp and spasm: Secondary | ICD-10-CM

## 2019-03-07 DIAGNOSIS — R600 Localized edema: Secondary | ICD-10-CM | POA: Diagnosis not present

## 2019-04-13 DIAGNOSIS — R600 Localized edema: Secondary | ICD-10-CM | POA: Diagnosis not present

## 2019-04-13 DIAGNOSIS — I10 Essential (primary) hypertension: Secondary | ICD-10-CM | POA: Diagnosis not present

## 2019-04-13 DIAGNOSIS — R252 Cramp and spasm: Secondary | ICD-10-CM | POA: Diagnosis not present

## 2019-05-05 IMAGING — US US ABDOMEN COMPLETE
1 series · 14 of 25 positions shown · non-contrast
Comparison: None.

CLINICAL DATA: Epigastric abdominal pain for 3 months.

EXAM:
ABDOMEN ULTRASOUND COMPLETE

[Series 1: us abdomen complete · 0.20mm/px · 14 of 83 slices shown]
[im 1/83]
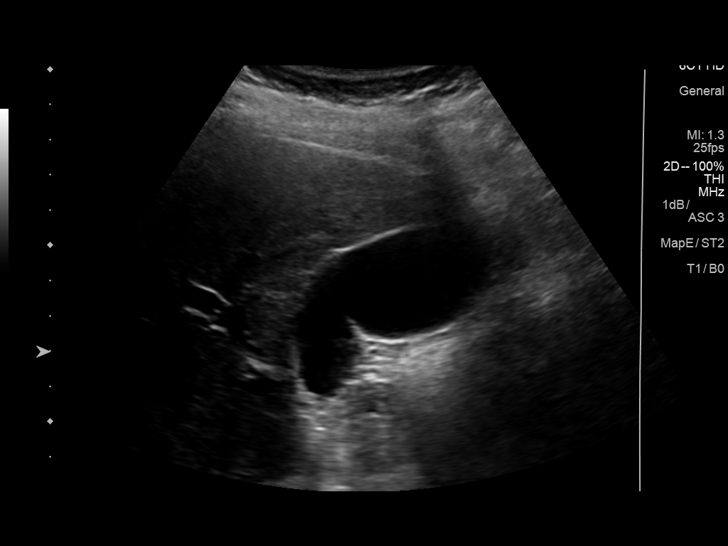
[im 7/83]
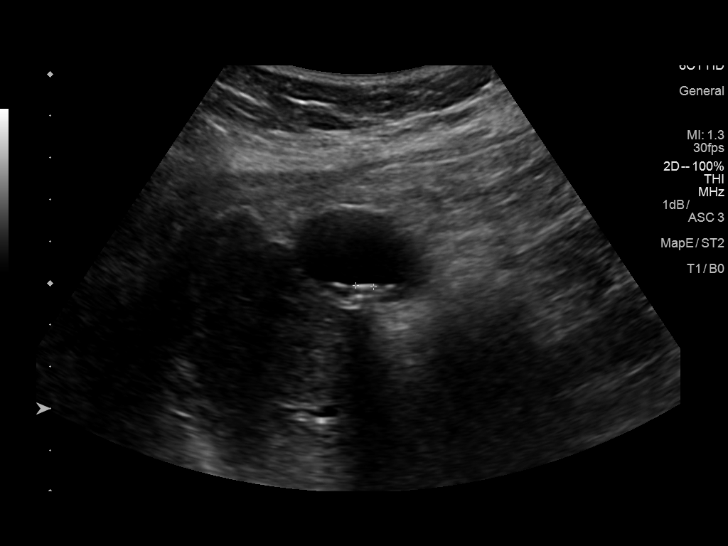
[im 14/83]
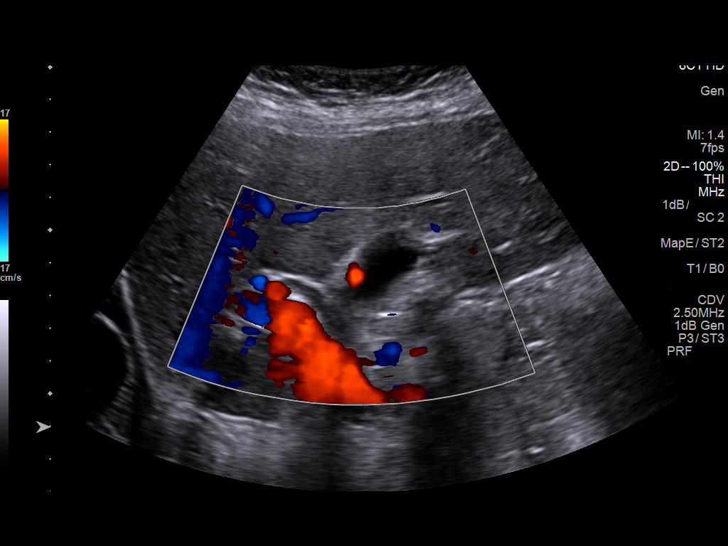
[im 21/83]
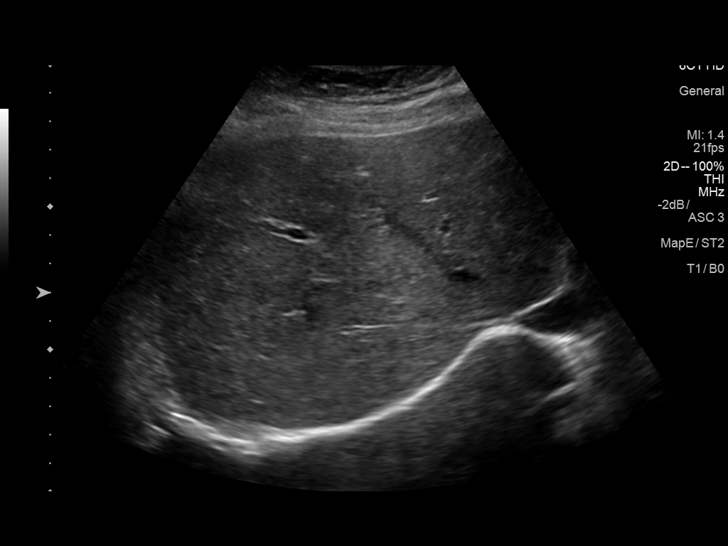
[im 28/83]
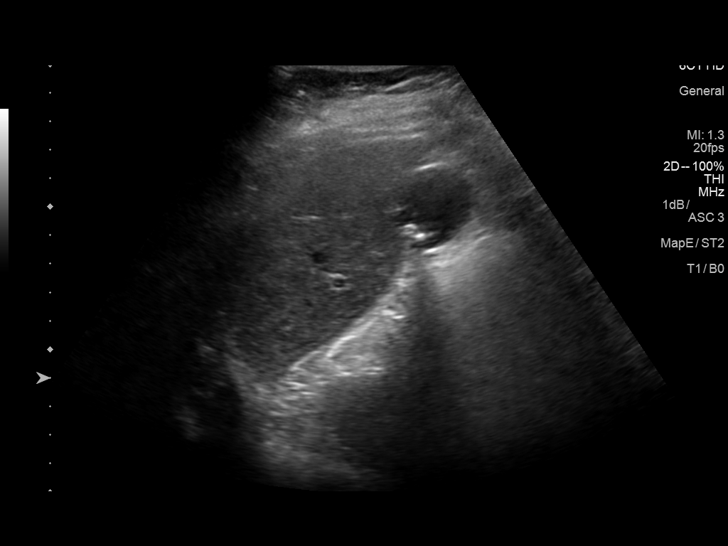
[im 31/83]
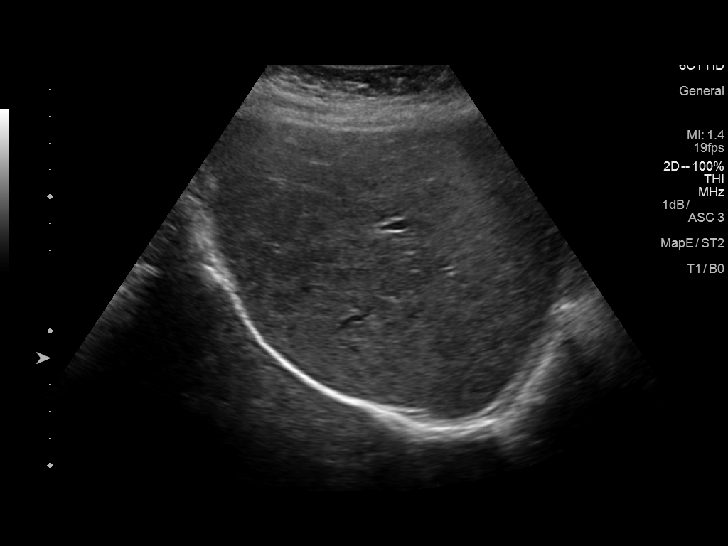
[im 38/83]
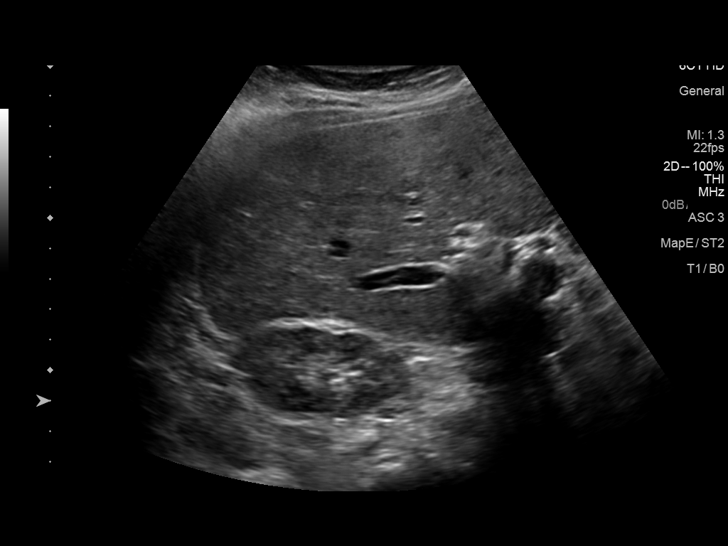
[im 45/83]
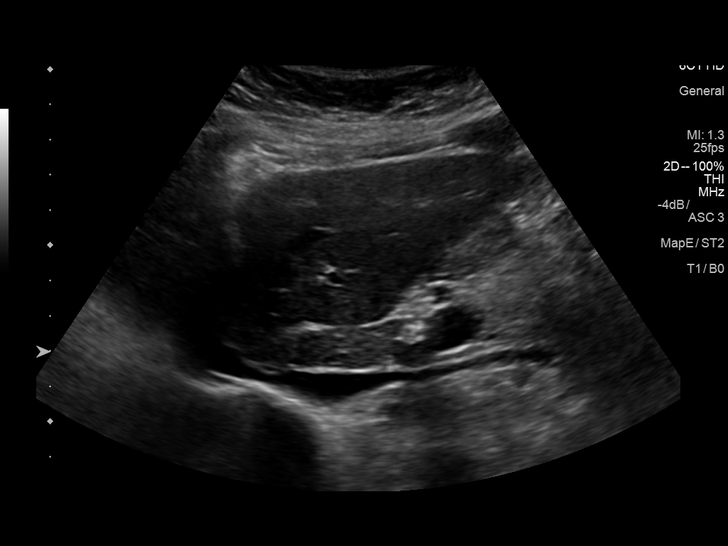
[im 52/83]
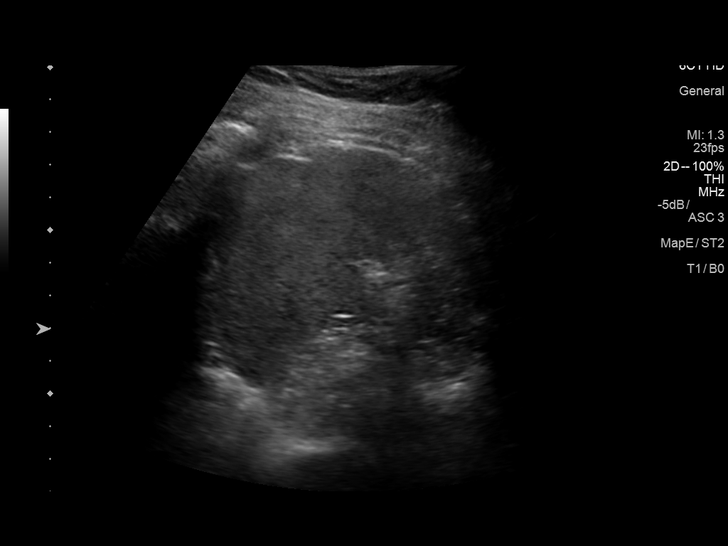
[im 55/83]
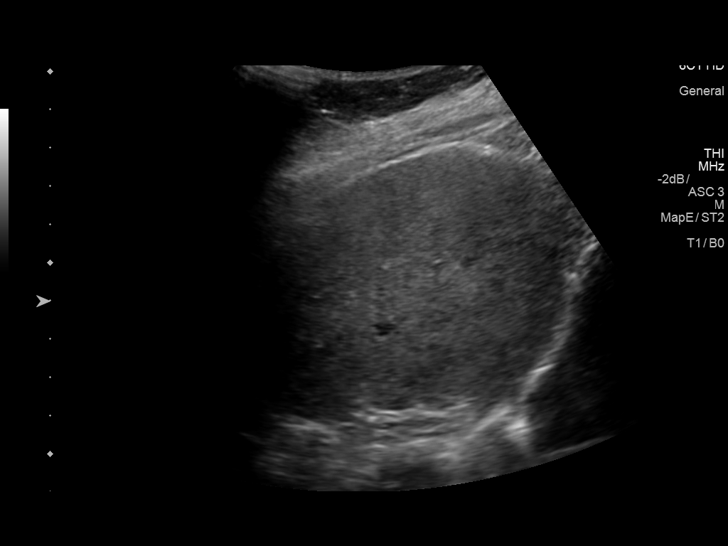
[im 62/83]
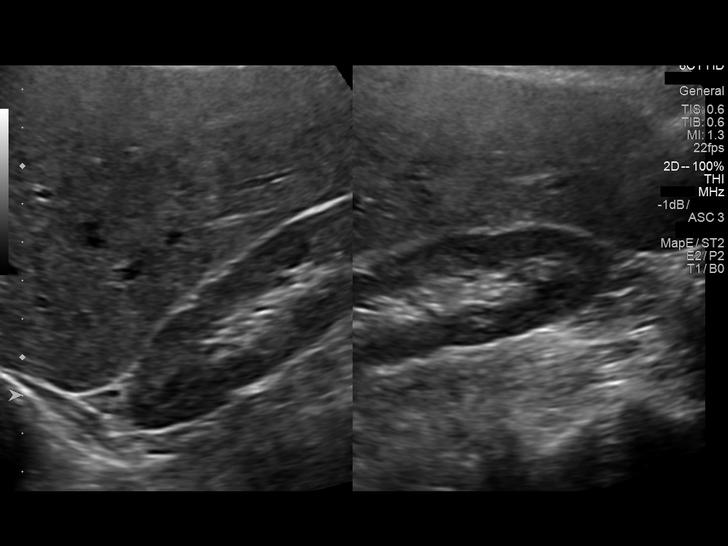
[im 69/83]
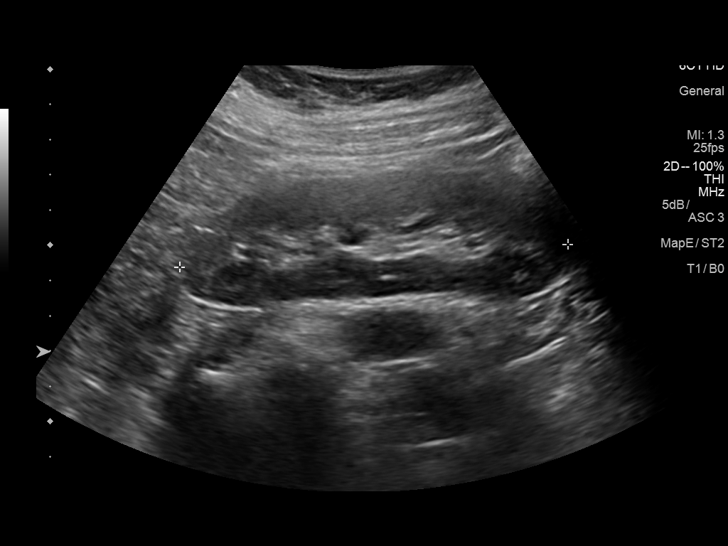
[im 76/83]
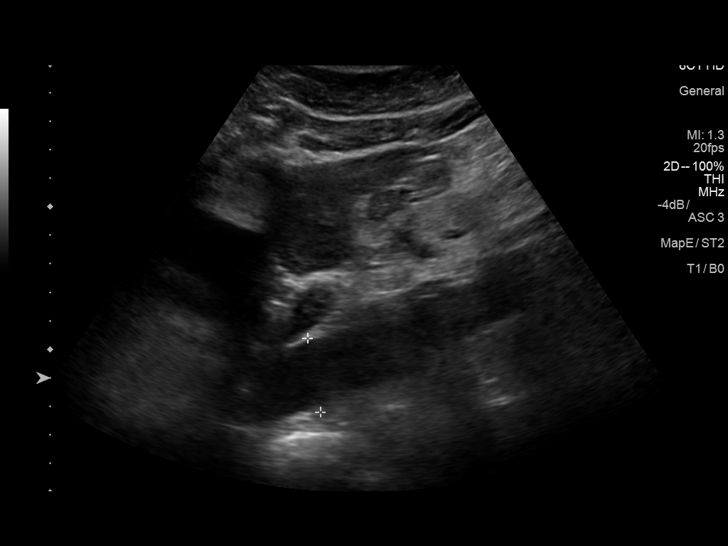
[im 83/83]
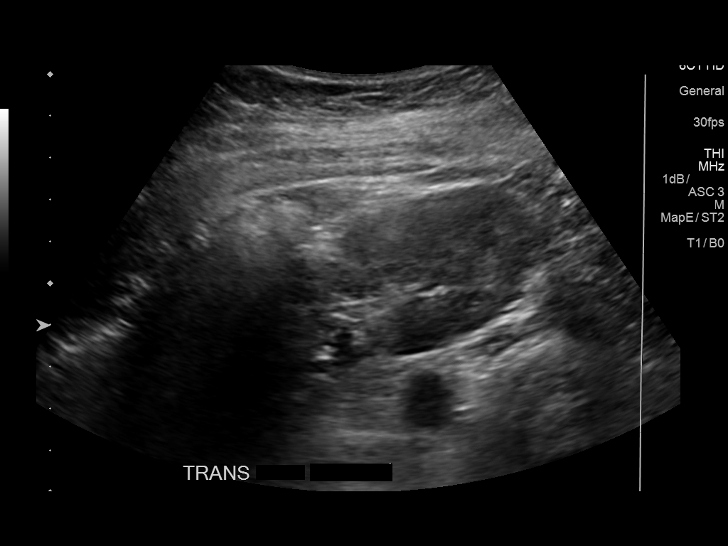

[14 of 25 positions shown; findings below may reference images not displayed]

FINDINGS: Gallbladder: Multiple gallstones are noted with the largest
measuring 4 mm. No gallbladder wall thickening or pericholecystic
fluid is noted. No sonographic Murphy's sign is noted.

Common bile duct: Diameter: 3.7 mm which is within normal limits.

Liver: No focal lesion identified. Within normal limits in
parenchymal echogenicity. Portal vein is patent on color Doppler
imaging with normal direction of blood flow towards the liver.

IVC: No abnormality visualized.

Pancreas: Visualized portion unremarkable.

Spleen: Size and appearance within normal limits.

Right Kidney: Length: 10.1 cm. Echogenicity within normal limits. No
mass or hydronephrosis visualized.

Left Kidney: Length: 11 cm. Echogenicity within normal limits. No
mass or hydronephrosis visualized.

Abdominal aorta: No aneurysm visualized.

Other findings: None.
IMPRESSION: Cholelithiasis without evidence of cholecystitis. No other
abnormality seen in the abdomen.

## 2019-06-06 DIAGNOSIS — M1612 Unilateral primary osteoarthritis, left hip: Secondary | ICD-10-CM | POA: Diagnosis not present

## 2019-06-21 DIAGNOSIS — H25013 Cortical age-related cataract, bilateral: Secondary | ICD-10-CM | POA: Diagnosis not present

## 2019-06-21 DIAGNOSIS — H18413 Arcus senilis, bilateral: Secondary | ICD-10-CM | POA: Diagnosis not present

## 2019-06-21 DIAGNOSIS — H2513 Age-related nuclear cataract, bilateral: Secondary | ICD-10-CM | POA: Diagnosis not present

## 2019-06-21 DIAGNOSIS — H25043 Posterior subcapsular polar age-related cataract, bilateral: Secondary | ICD-10-CM | POA: Diagnosis not present

## 2019-07-18 DIAGNOSIS — I10 Essential (primary) hypertension: Secondary | ICD-10-CM | POA: Diagnosis not present

## 2019-07-18 DIAGNOSIS — F411 Generalized anxiety disorder: Secondary | ICD-10-CM | POA: Diagnosis not present

## 2019-07-18 DIAGNOSIS — F329 Major depressive disorder, single episode, unspecified: Secondary | ICD-10-CM | POA: Diagnosis not present

## 2019-08-22 DIAGNOSIS — M1612 Unilateral primary osteoarthritis, left hip: Secondary | ICD-10-CM | POA: Diagnosis not present

## 2019-08-31 DIAGNOSIS — M1612 Unilateral primary osteoarthritis, left hip: Secondary | ICD-10-CM | POA: Diagnosis not present

## 2019-11-19 DIAGNOSIS — Z1159 Encounter for screening for other viral diseases: Secondary | ICD-10-CM | POA: Diagnosis not present

## 2019-11-19 DIAGNOSIS — Z0000 Encounter for general adult medical examination without abnormal findings: Secondary | ICD-10-CM | POA: Diagnosis not present

## 2019-11-21 DIAGNOSIS — I10 Essential (primary) hypertension: Secondary | ICD-10-CM | POA: Diagnosis not present

## 2019-11-21 DIAGNOSIS — R319 Hematuria, unspecified: Secondary | ICD-10-CM | POA: Diagnosis not present

## 2019-11-22 DIAGNOSIS — Z0000 Encounter for general adult medical examination without abnormal findings: Secondary | ICD-10-CM | POA: Diagnosis not present

## 2019-11-22 DIAGNOSIS — Z1211 Encounter for screening for malignant neoplasm of colon: Secondary | ICD-10-CM | POA: Diagnosis not present

## 2019-12-17 DIAGNOSIS — Z01810 Encounter for preprocedural cardiovascular examination: Secondary | ICD-10-CM | POA: Diagnosis not present

## 2019-12-17 DIAGNOSIS — Z13820 Encounter for screening for osteoporosis: Secondary | ICD-10-CM | POA: Diagnosis not present

## 2019-12-17 DIAGNOSIS — Z6826 Body mass index (BMI) 26.0-26.9, adult: Secondary | ICD-10-CM | POA: Diagnosis not present

## 2019-12-17 DIAGNOSIS — Z01419 Encounter for gynecological examination (general) (routine) without abnormal findings: Secondary | ICD-10-CM | POA: Diagnosis not present

## 2020-01-07 DIAGNOSIS — M1612 Unilateral primary osteoarthritis, left hip: Secondary | ICD-10-CM | POA: Diagnosis not present

## 2020-01-27 DIAGNOSIS — J069 Acute upper respiratory infection, unspecified: Secondary | ICD-10-CM | POA: Diagnosis not present

## 2020-02-06 DIAGNOSIS — L255 Unspecified contact dermatitis due to plants, except food: Secondary | ICD-10-CM | POA: Diagnosis not present

## 2020-03-05 DIAGNOSIS — F411 Generalized anxiety disorder: Secondary | ICD-10-CM | POA: Diagnosis not present

## 2020-03-05 DIAGNOSIS — I10 Essential (primary) hypertension: Secondary | ICD-10-CM | POA: Diagnosis not present

## 2020-03-05 DIAGNOSIS — E039 Hypothyroidism, unspecified: Secondary | ICD-10-CM | POA: Diagnosis not present

## 2020-03-05 DIAGNOSIS — J4540 Moderate persistent asthma, uncomplicated: Secondary | ICD-10-CM | POA: Diagnosis not present

## 2020-04-01 DIAGNOSIS — H25043 Posterior subcapsular polar age-related cataract, bilateral: Secondary | ICD-10-CM | POA: Diagnosis not present

## 2020-04-01 DIAGNOSIS — H2513 Age-related nuclear cataract, bilateral: Secondary | ICD-10-CM | POA: Diagnosis not present

## 2020-04-01 DIAGNOSIS — H25013 Cortical age-related cataract, bilateral: Secondary | ICD-10-CM | POA: Diagnosis not present

## 2020-04-01 DIAGNOSIS — H2512 Age-related nuclear cataract, left eye: Secondary | ICD-10-CM | POA: Diagnosis not present

## 2020-04-01 DIAGNOSIS — H18413 Arcus senilis, bilateral: Secondary | ICD-10-CM | POA: Diagnosis not present

## 2020-05-06 DIAGNOSIS — S161XXA Strain of muscle, fascia and tendon at neck level, initial encounter: Secondary | ICD-10-CM | POA: Diagnosis not present

## 2020-06-23 DIAGNOSIS — H52202 Unspecified astigmatism, left eye: Secondary | ICD-10-CM | POA: Diagnosis not present

## 2020-06-23 DIAGNOSIS — H2512 Age-related nuclear cataract, left eye: Secondary | ICD-10-CM | POA: Diagnosis not present

## 2020-06-24 DIAGNOSIS — H2511 Age-related nuclear cataract, right eye: Secondary | ICD-10-CM | POA: Diagnosis not present

## 2020-07-14 DIAGNOSIS — H52201 Unspecified astigmatism, right eye: Secondary | ICD-10-CM | POA: Diagnosis not present

## 2020-07-14 DIAGNOSIS — H2511 Age-related nuclear cataract, right eye: Secondary | ICD-10-CM | POA: Diagnosis not present

## 2020-07-14 DIAGNOSIS — H2512 Age-related nuclear cataract, left eye: Secondary | ICD-10-CM | POA: Diagnosis not present

## 2020-08-01 DIAGNOSIS — J069 Acute upper respiratory infection, unspecified: Secondary | ICD-10-CM | POA: Diagnosis not present

## 2020-08-11 DIAGNOSIS — R300 Dysuria: Secondary | ICD-10-CM | POA: Diagnosis not present

## 2020-08-11 DIAGNOSIS — R319 Hematuria, unspecified: Secondary | ICD-10-CM | POA: Diagnosis not present

## 2020-10-27 DIAGNOSIS — M25512 Pain in left shoulder: Secondary | ICD-10-CM | POA: Diagnosis not present

## 2020-11-04 DIAGNOSIS — M25512 Pain in left shoulder: Secondary | ICD-10-CM | POA: Diagnosis not present

## 2020-11-24 DIAGNOSIS — M79671 Pain in right foot: Secondary | ICD-10-CM | POA: Diagnosis not present

## 2020-12-08 DIAGNOSIS — M79671 Pain in right foot: Secondary | ICD-10-CM | POA: Diagnosis not present

## 2020-12-08 DIAGNOSIS — S92404A Nondisplaced unspecified fracture of right great toe, initial encounter for closed fracture: Secondary | ICD-10-CM | POA: Diagnosis not present

## 2020-12-08 DIAGNOSIS — S92514A Nondisplaced fracture of proximal phalanx of right lesser toe(s), initial encounter for closed fracture: Secondary | ICD-10-CM | POA: Diagnosis not present

## 2020-12-23 DIAGNOSIS — E782 Mixed hyperlipidemia: Secondary | ICD-10-CM | POA: Diagnosis not present

## 2020-12-23 DIAGNOSIS — Z0000 Encounter for general adult medical examination without abnormal findings: Secondary | ICD-10-CM | POA: Diagnosis not present

## 2020-12-25 DIAGNOSIS — Z01419 Encounter for gynecological examination (general) (routine) without abnormal findings: Secondary | ICD-10-CM | POA: Diagnosis not present

## 2020-12-25 DIAGNOSIS — Z6828 Body mass index (BMI) 28.0-28.9, adult: Secondary | ICD-10-CM | POA: Diagnosis not present

## 2020-12-30 DIAGNOSIS — H6123 Impacted cerumen, bilateral: Secondary | ICD-10-CM | POA: Diagnosis not present

## 2020-12-30 DIAGNOSIS — Z1211 Encounter for screening for malignant neoplasm of colon: Secondary | ICD-10-CM | POA: Diagnosis not present

## 2020-12-30 DIAGNOSIS — K219 Gastro-esophageal reflux disease without esophagitis: Secondary | ICD-10-CM | POA: Diagnosis not present

## 2020-12-30 DIAGNOSIS — H6121 Impacted cerumen, right ear: Secondary | ICD-10-CM | POA: Diagnosis not present

## 2020-12-30 DIAGNOSIS — Z0000 Encounter for general adult medical examination without abnormal findings: Secondary | ICD-10-CM | POA: Diagnosis not present

## 2020-12-30 DIAGNOSIS — Z23 Encounter for immunization: Secondary | ICD-10-CM | POA: Diagnosis not present

## 2020-12-30 DIAGNOSIS — H6122 Impacted cerumen, left ear: Secondary | ICD-10-CM | POA: Diagnosis not present

## 2021-01-08 DIAGNOSIS — S92514A Nondisplaced fracture of proximal phalanx of right lesser toe(s), initial encounter for closed fracture: Secondary | ICD-10-CM | POA: Diagnosis not present

## 2021-01-08 DIAGNOSIS — S92404A Nondisplaced unspecified fracture of right great toe, initial encounter for closed fracture: Secondary | ICD-10-CM | POA: Diagnosis not present

## 2021-02-02 ENCOUNTER — Encounter: Payer: Self-pay | Admitting: Gastroenterology

## 2021-02-19 DIAGNOSIS — F411 Generalized anxiety disorder: Secondary | ICD-10-CM | POA: Diagnosis not present

## 2021-02-19 DIAGNOSIS — J4540 Moderate persistent asthma, uncomplicated: Secondary | ICD-10-CM | POA: Diagnosis not present

## 2021-02-19 DIAGNOSIS — J45990 Exercise induced bronchospasm: Secondary | ICD-10-CM | POA: Diagnosis not present

## 2021-02-19 DIAGNOSIS — E039 Hypothyroidism, unspecified: Secondary | ICD-10-CM | POA: Diagnosis not present

## 2021-02-26 DIAGNOSIS — R7989 Other specified abnormal findings of blood chemistry: Secondary | ICD-10-CM | POA: Diagnosis not present

## 2021-02-26 DIAGNOSIS — R7303 Prediabetes: Secondary | ICD-10-CM | POA: Diagnosis not present

## 2021-02-26 DIAGNOSIS — E782 Mixed hyperlipidemia: Secondary | ICD-10-CM | POA: Diagnosis not present

## 2021-02-26 DIAGNOSIS — I10 Essential (primary) hypertension: Secondary | ICD-10-CM | POA: Diagnosis not present

## 2021-03-09 DIAGNOSIS — E039 Hypothyroidism, unspecified: Secondary | ICD-10-CM | POA: Diagnosis not present

## 2021-03-09 DIAGNOSIS — I10 Essential (primary) hypertension: Secondary | ICD-10-CM | POA: Diagnosis not present

## 2021-03-09 DIAGNOSIS — E559 Vitamin D deficiency, unspecified: Secondary | ICD-10-CM | POA: Diagnosis not present

## 2021-03-12 DIAGNOSIS — K219 Gastro-esophageal reflux disease without esophagitis: Secondary | ICD-10-CM | POA: Diagnosis not present

## 2021-03-12 DIAGNOSIS — I10 Essential (primary) hypertension: Secondary | ICD-10-CM | POA: Diagnosis not present

## 2021-03-12 DIAGNOSIS — E039 Hypothyroidism, unspecified: Secondary | ICD-10-CM | POA: Diagnosis not present

## 2021-03-12 DIAGNOSIS — E559 Vitamin D deficiency, unspecified: Secondary | ICD-10-CM | POA: Diagnosis not present

## 2021-04-09 ENCOUNTER — Encounter: Payer: Self-pay | Admitting: Gastroenterology

## 2021-06-17 ENCOUNTER — Other Ambulatory Visit: Payer: Self-pay

## 2021-06-17 ENCOUNTER — Ambulatory Visit (AMBULATORY_SURGERY_CENTER): Payer: BC Managed Care – PPO | Admitting: *Deleted

## 2021-06-17 VITALS — Ht 59.0 in | Wt 134.0 lb

## 2021-06-17 DIAGNOSIS — Z1211 Encounter for screening for malignant neoplasm of colon: Secondary | ICD-10-CM

## 2021-06-17 MED ORDER — NA SULFATE-K SULFATE-MG SULF 17.5-3.13-1.6 GM/177ML PO SOLN: 1.0000 | ORAL | 0 refills | Status: DC

## 2021-06-17 NOTE — Progress Notes (Signed)

## 2021-06-29 ENCOUNTER — Encounter: Payer: Self-pay | Admitting: Gastroenterology

## 2021-06-30 ENCOUNTER — Encounter: Payer: Self-pay | Admitting: Certified Registered Nurse Anesthetist

## 2021-07-01 ENCOUNTER — Ambulatory Visit (AMBULATORY_SURGERY_CENTER): Payer: Medicare Other | Admitting: Gastroenterology

## 2021-07-01 ENCOUNTER — Encounter: Payer: Self-pay | Admitting: Gastroenterology

## 2021-07-01 VITALS — BP 128/83 | HR 71 | Temp 98.9°F | Resp 19 | Ht 59.0 in | Wt 134.0 lb

## 2021-07-01 DIAGNOSIS — Z1211 Encounter for screening for malignant neoplasm of colon: Secondary | ICD-10-CM

## 2021-07-01 MED ORDER — SODIUM CHLORIDE 0.9 % IV SOLN: 500.0000 mL | Freq: Once | INTRAVENOUS | Status: DC

## 2021-07-01 NOTE — Patient Instructions (Signed)
Resume previous diet and medications. Repeat Colonoscopy in 10 years for surveillance.  YOU HAD AN ENDOSCOPIC PROCEDURE TODAY AT [facility]:   Refer to the procedure report that was given to you for any specific questions about what was found during the examination.  If the procedure report does not answer your questions, please call your gastroenterologist to clarify.  If you requested that your care partner not be given the details of your procedure findings, then the procedure report has been included in a sealed envelope for you to review at your convenience later.  YOU SHOULD EXPECT: Some feelings of bloating in the abdomen. Passage of more gas than usual.  Walking can help get rid of the air that was put into your GI tract during the procedure and reduce the bloating. If you had a lower endoscopy (such as a colonoscopy or flexible sigmoidoscopy) you may notice spotting of blood in your stool or on the toilet paper. If you underwent a bowel prep for your procedure, you may not have a normal bowel movement for a few days.  Please Note:  You might notice some irritation and congestion in your nose or some drainage.  This is from the oxygen used during your procedure.  There is no need for concern and it should clear up in a day or so.  SYMPTOMS TO REPORT IMMEDIATELY:  Following lower endoscopy (colonoscopy or flexible sigmoidoscopy):  Excessive amounts of blood in the stool  Significant tenderness or worsening of abdominal pains  Swelling of the abdomen that is new, acute  Fever of 100F or higher  For urgent or emergent issues, a gastroenterologist can be reached at any hour by calling [phone]. Do not use MyChart messaging for urgent concerns.    DIET:  We do recommend a small meal at first, but then you may proceed to your regular diet.  Drink plenty of fluids but you should avoid alcoholic beverages for 24 hours.  ACTIVITY:  You should plan to take it easy for the  rest of today and you should NOT DRIVE or use heavy machinery until tomorrow (because of the sedation medicines used during the test).    FOLLOW UP: Our staff will call the number listed on your records 48-72 hours following your procedure to check on you and address any questions or concerns that you may have regarding the information given to you following your procedure. If we do not reach you, we will leave a message.  We will attempt to reach you two times.  During this call, we will ask if you have developed any symptoms of COVID 19. If you develop any symptoms (ie: fever, flu-like symptoms, shortness of breath, cough etc.) before then, please call [phone].  If you test positive for Covid 19 in the 2 weeks post procedure, please call and report this information to [place].    If any biopsies were taken you will be contacted by phone or by letter within the next 1-3 weeks.  Please call us at [phone] if you have not heard about the biopsies in 3 weeks.    SIGNATURES/CONFIDENTIALITY: You and/or your care partner have signed paperwork which will be entered into your electronic medical record.  These signatures attest to the fact that that the information above on your After Visit Summary has been reviewed and is understood.  Full responsibility of the confidentiality of this discharge information lies with you and/or your care-partner.

## 2021-07-01 NOTE — Progress Notes (Signed)
VS  DT ? ?Pt's states no medical or surgical changes since previsit or office visit. ? ?

## 2021-07-01 NOTE — Progress Notes (Signed)
Report given to PACU, vss 

## 2021-07-01 NOTE — Op Note (Signed)
[facility] Patient Name: Shirley Johnson Procedure Date: 07/01/2021 9:34 AM MRN: [id number] Endoscopist: Thornton Park MD, MD Age: 66 Referring MD:  Date of Birth: Jan 16, 1956 Gender: Female Account #: [id number] Procedure:                Colonoscopy Indications:              Screening for colorectal malignant neoplasm                           Last colonoscopy was 2012 with Dr. Deatra Ina - normal                           No known family history of colon cancer or polyps Medicines:                Monitored Anesthesia Care Procedure:                Pre-Anesthesia Assessment:                           - Prior to the procedure, a History and Physical                            was performed, and patient medications and                            allergies were reviewed. The patient's tolerance of                            previous anesthesia was also reviewed. The risks                            and benefits of the procedure and the sedation                            options and risks were discussed with the patient.                            All questions were answered, and informed consent                            was obtained. Prior Anticoagulants: The patient has                            taken no previous anticoagulant or antiplatelet                            agents. ASA Grade Assessment: II - A patient with                            mild systemic disease. After reviewing the risks                            and benefits, the patient was deemed in  satisfactory condition to undergo the procedure.                           After obtaining informed consent, the colonoscope                            was passed under direct vision. Throughout the                            procedure, the patient's blood pressure, pulse, and                            oxygen saturations were monitored continuously. The                            Olympus  CF-HQ190L ([id number]) Colonoscope was                            introduced through the anus and advanced to the 3                            cm into the ileum. A second forward view of the                            right colon was performed. The colonoscopy was                            performed without difficulty. The patient tolerated                            the procedure well. The quality of the bowel                            preparation was excellent. The terminal ileum,                            ileocecal valve, appendiceal orifice, and rectum                            were photographed. Scope In: 9:49:23 AM Scope Out: 10:02:51 AM Scope Withdrawal Time: 0 hours 10 minutes 28 seconds  Total Procedure Duration: 0 hours 13 minutes 28 seconds  Findings:                 The perianal and digital rectal examinations were                            normal.                           Non-bleeding internal hemorrhoids were found.                           Multiple small and large-mouthed diverticula were  found in the sigmoid colon.                           The exam was otherwise without abnormality on                            direct and retroflexion views. Complications:            No immediate complications. Estimated Blood Loss:     Estimated blood loss: none. Impression:               - Non-bleeding internal hemorrhoids.                           - Diverticulosis in the sigmoid colon.                           - The examination was otherwise normal on direct                            and retroflexion views.                           - No specimens collected. Recommendation:           - Patient has a contact number available for                            emergencies. The signs and symptoms of potential                            delayed complications were discussed with the                            patient. Return to normal activities tomorrow.                             Written discharge instructions were provided to the                            patient.                           - Continue present medications.                           - Repeat colonoscopy in 10 years for surveillance,                            earlier with new symptoms.                           - Follow a high fiber diet. Drink at least 64                            ounces of water daily. Add a daily stool bulking  agent such as psyllium (an exampled would be                            Metamucil).                           - Emerging evidence supports eating a diet of                            fruits, vegetables, grains, calcium, and yogurt                            while reducing red meat and alcohol may reduce the                            risk of colon cancer.                           - Thank you for allowing me to be involved in your                            colon cancer prevention. Thornton Park MD, MD 07/01/2021 10:07:00 AM This report has been signed electronically.

## 2021-07-01 NOTE — Progress Notes (Signed)
Referring Provider: Fanny Bien, MD Primary Care Physician:  Fanny Bien, MD  Reason for Procedure:  Colon cancer screening   IMPRESSION:  Need for colon cancer screening Appropriate candidate for monitored anesthesia care  PLAN: Colonoscopy in the [facility] today   HPI: Shirley Johnson is a 66 y.o. female presents for screening colonoscopy.  Internal hemorrhoids on colonoscopy with Dr. Deatra Ina 2012  No baseline GI symptoms.   No known family history of colon cancer or polyps. No family history of uterine/endometrial cancer, pancreatic cancer or gastric/stomach cancer.   Past Medical History:  Diagnosis Date   Allergy    pollen   Anxiety    Asthma    Depression    Hypertension    Thyroid disease    Vitamin D deficiency     Past Surgical History:  Procedure Laterality Date   ANTERIOR AND POSTERIOR REPAIR N/A 11/13/2013   Procedure: POSTERIOR REPAIR (RECTOCELE), ;  Surgeon: Cyril Mourning, MD;  Location: [facility];  Service: Gynecology;  Laterality: N/A;   BACK SURGERY     CATARACT EXTRACTION W/ INTRAOCULAR LENS IMPLANT     CHOLECYSTECTOMY     COLONOSCOPY  10/16/2010   Dr.Kaplan   HEMORRHOID SURGERY     banding   LAPAROSCOPIC ASSISTED VAGINAL HYSTERECTOMY N/A 11/13/2013   Procedure: LAPAROSCOPIC ASSISTED VAGINAL HYSTERECTOMY WITH BILATERAL SALPINGO OOPHORECTOMY;  Surgeon: Cyril Mourning, MD;  Location: [facility];  Service: Gynecology;  Laterality: N/A;   SIGMOIDOSCOPY     TOTAL HIP ARTHROPLASTY     TUBAL LIGATION      Current Outpatient Medications  Medication Sig Dispense Refill   acetaminophen (TYLENOL) 500 MG tablet Take 1,500 mg by mouth at bedtime.     Cholecalciferol (VITAMIN D) 2000 UNITS tablet Take 4,000 Units by mouth daily. Takes 4000 units daily     escitalopram (LEXAPRO) 20 MG tablet Take 20 mg by mouth daily.     ibuprofen (ADVIL,MOTRIN) 600 MG tablet Take 1 tablet (600 mg total) by mouth every 6 (six) hours as needed (mild pain). 60  tablet 0   levothyroxine (SYNTHROID, LEVOTHROID) 75 MCG tablet TAKE 1 TABLET (75 MCG TOTAL) BY MOUTH DAILY BEFORE BREAKFAST. 30 tablet 1   lisinopril (ZESTRIL) 10 MG tablet Take 10 mg by mouth daily.     montelukast (SINGULAIR) 10 MG tablet Take 10 mg by mouth daily.     senna (SENOKOT) 8.6 MG tablet Take 1 tablet by mouth as needed for constipation.     SYMBICORT 160-4.5 MCG/ACT inhaler Inhale 2 puffs into the lungs 2 (two) times daily.     albuterol (VENTOLIN HFA) 108 (90 Base) MCG/ACT inhaler ProAir HFA 90 mcg/actuation aerosol inhaler     docusate sodium (COLACE) 100 MG capsule Stool Softener     omeprazole (PRILOSEC) 40 MG capsule omeprazole 40 mg capsule,delayed release     Current Facility-Administered Medications  Medication Dose Route Frequency Provider Last Rate Last Admin   0.9 %  sodium chloride infusion  500 mL Intravenous Once Thornton Park, MD        Allergies as of 07/01/2021 - Review Complete 07/01/2021  Allergen Reaction Noted   Doxycycline Nausea And Vomiting 09/25/2010   Sulfa antibiotics Nausea And Vomiting 09/25/2010   Vicodin [hydrocodone-acetaminophen] Other (See Comments) 09/25/2010   Erythromycin Rash 09/25/2010   Penicillins Rash 09/25/2010   Tetracyclines & related Rash 09/25/2010    Family History  Problem Relation Age of Onset   Hypertension Mother    COPD  Mother    Alzheimer's disease Mother    Prostate cancer Father    Heart disease Father    Cancer Father        Prostate-METS   Thyroid disease Father    Colon cancer Neg Hx    Colon polyps Neg Hx    Esophageal cancer Neg Hx    Rectal cancer Neg Hx    Stomach cancer Neg Hx      Physical Exam: General:   Alert,  well-nourished, pleasant and cooperative in NAD Head:  Normocephalic and atraumatic. Eyes:  Sclera clear, no icterus.   Conjunctiva pink. Mouth:  No deformity or lesions.   Neck:  Supple; no masses or thyromegaly. Lungs:  Clear throughout to auscultation.   No wheezes. Heart:   Regular rate and rhythm; no murmurs. Abdomen:  Soft, non-tender, nondistended, normal bowel sounds, no rebound or guarding.  Msk:  Symmetrical. No boney deformities LAD: No inguinal or umbilical LAD Extremities:  No clubbing or edema. Neurologic:  Alert and  oriented x4;  grossly nonfocal Skin:  No obvious rash or bruise. Psych:  Alert and cooperative. Normal mood and affect.     Studies/Results: No results found.    Shirley Lienhard L. Tarri Glenn, MD, MPH 07/01/2021, 9:18 AM

## 2021-07-03 ENCOUNTER — Telehealth: Payer: Self-pay | Admitting: *Deleted

## 2021-07-03 NOTE — Telephone Encounter (Signed)
°  Follow up Call-  Call back number 07/01/2021  Post procedure Call Back phone  # [phone]  Permission to leave phone message Yes  Some recent data might be hidden     Patient questions:  Do you have a fever, pain , or abdominal swelling? No. Pain Score  0 *  Have you tolerated food without any problems? Yes.    Have you been able to return to your normal activities? Yes.    Do you have any questions about your discharge instructions: Diet   No. Medications  No. Follow up visit  No.  Do you have questions or concerns about your Care? No.  Actions: * If pain score is 4 or above: No action needed, pain <4.

## 2022-04-07 ENCOUNTER — Other Ambulatory Visit: Payer: Self-pay | Admitting: Family Medicine

## 2022-04-07 DIAGNOSIS — E782 Mixed hyperlipidemia: Secondary | ICD-10-CM

## 2022-06-11 ENCOUNTER — Ambulatory Visit
Admission: RE | Admit: 2022-06-11 | Discharge: 2022-06-11 | Disposition: A | Discharge status: Home / Self Care | Payer: No Typology Code available for payment source | Source: Ambulatory Visit | Attending: Family Medicine | Admitting: Family Medicine

## 2022-06-11 DIAGNOSIS — E782 Mixed hyperlipidemia: Secondary | ICD-10-CM

## 2024-02-08 ENCOUNTER — Ambulatory Visit

## 2024-02-10 ENCOUNTER — Other Ambulatory Visit: Payer: Self-pay

## 2024-02-10 ENCOUNTER — Ambulatory Visit: Attending: Family Medicine

## 2024-02-10 DIAGNOSIS — R262 Difficulty in walking, not elsewhere classified: Secondary | ICD-10-CM | POA: Insufficient documentation

## 2024-02-10 DIAGNOSIS — R293 Abnormal posture: Secondary | ICD-10-CM | POA: Diagnosis present

## 2024-02-10 DIAGNOSIS — M4125 Other idiopathic scoliosis, thoracolumbar region: Secondary | ICD-10-CM | POA: Insufficient documentation

## 2024-02-10 DIAGNOSIS — M5459 Other low back pain: Secondary | ICD-10-CM | POA: Insufficient documentation

## 2024-02-10 NOTE — Therapy (Signed)
 OUTPATIENT PHYSICAL THERAPY THORACOLUMBAR EVALUATION   Patient Name: Shirley Johnson MRN: [id number] DOB:[birth year], 68 y.o., female Today's Date: 02/10/2024  END OF SESSION:  PT End of Session - 02/10/24 0818     Visit Number 1    Date for Recertification  04/06/24    Progress Note Due on Visit 10    PT Start Time 0815    PT Stop Time 0845    PT Time Calculation (min) 30 min    Activity Tolerance Patient tolerated treatment well    Behavior During Therapy [facility] for tasks assessed/performed          Past Medical History:  Diagnosis Date   Allergy    pollen   Anxiety    Asthma    Depression    Hypertension    Thyroid  disease    Vitamin D  deficiency    Past Surgical History:  Procedure Laterality Date   ANTERIOR AND POSTERIOR REPAIR N/A 11/13/2013   Procedure: POSTERIOR REPAIR (RECTOCELE), ;  Surgeon: Rosaline LITTIE Cobble, MD;  Location: [facility];  Service: Gynecology;  Laterality: N/A;   BACK SURGERY     CATARACT EXTRACTION W/ INTRAOCULAR LENS IMPLANT     CHOLECYSTECTOMY     COLONOSCOPY  10/16/2010   Dr.Kaplan   HEMORRHOID SURGERY     banding   LAPAROSCOPIC ASSISTED VAGINAL HYSTERECTOMY N/A 11/13/2013   Procedure: LAPAROSCOPIC ASSISTED VAGINAL HYSTERECTOMY WITH BILATERAL SALPINGO OOPHORECTOMY;  Surgeon: Rosaline LITTIE Cobble, MD;  Location: [facility];  Service: Gynecology;  Laterality: N/A;   SIGMOIDOSCOPY     TOTAL HIP ARTHROPLASTY     TUBAL LIGATION     Patient Active Problem List   Diagnosis Date Noted   Hypothyroidism 01/10/2014   S/P laparoscopic assisted vaginal hysterectomy (LAVH) 11/13/2013   Mixed hyperlipidemia 05/30/2013   Other abnormal glucose 05/30/2013   Vitamin D  deficiency 05/30/2013   Allergy    Depression    Hypertension    Anxiety     PCP: Waylan Norris, MD  REFERRING PROVIDER: same  REFERRING DIAG: scoliosis  Rationale for Evaluation and Treatment: Rehabilitation  THERAPY DIAG:  Difficulty in walking, not elsewhere classified  Other  low back pain  Abnormal posture  Other idiopathic scoliosis, thoracolumbar region  ONSET DATE: chronic  SUBJECTIVE:                                                                                                                                                                                           SUBJECTIVE STATEMENT: My lower back hurts primarily at night, I can only sleep on one side , painful to turn over.  The flexeril helps  me sleep Also hurts when I walk at the 30 min mark, on each side of my spine  PERTINENT HISTORY:  Referred by PCP, due to scoliosis and ho lower back pain.  Had L THA 3 yrs ago  PAIN:  Are you having pain? Yes: NPRS scale: 0 to 5 Pain location: across lower back more on R than L but both sides Pain description: deep ache, sometimes cramping feeling Aggravating factors: walking 30 min, sleeping, lifting wheelbarrow Relieving factors: flexiril  PRECAUTIONS: None  RED FLAGS: None   WEIGHT BEARING RESTRICTIONS: No  FALLS:  Has patient fallen in last 6 months? No  LIVING ENVIRONMENT: Lives with: lives with their family Lives in: House/apartment Stairs: steps to basement uses occasionally Has following equipment at home: Single point cane  OCCUPATION: retired, cares for grand kids, goes to yoga and will start [place] [employer], walks several times a week  PLOF: Independent  PATIENT GOALS: be able to walk , sleep with less lower back pain  NEXT MD VISIT: in 3 months  OBJECTIVE:  Note: Objective measures were completed at Evaluation unless otherwise noted.  DIAGNOSTIC FINDINGS:  na  PATIENT SURVEYS:  Modified Oswestry Low Back Pain Disability Questionnaire: 8 / 50 = 16.0 %  COGNITION: Overall cognitive status: Within functional limits for tasks assessed     SENSATION: WFL  MUSCLE LENGTH: Hamstrings: Right wnl deg; Left wnl deg Debby test: Right wnl deg; Left wnl deg  POSTURE: R rib hump mild with forward flexion.  Lumbar region with L  concavity, R convexity thoracic spine, mild   PALPATION: Mild tenderness R gluteus medius  LUMBAR ROM:   AROM eval  Flexion wnl  Extension wnl  Right lateral flexion 60% with p!  Left lateral flexion 70% no pain  Right rotation   Left rotation    (Blank rows = not tested)  LOWER EXTREMITY ROM:   all WNL  LOWER EXTREMITY MMT:    MMT Right eval Left eval  Hip flexion wnl wnl  Hip extension lock bridge wnl wnl  Hip abduction 4/5 5/5  Hip adduction    Hip internal rotation    Hip external rotation wnl wnl  Knee flexion    Knee extension    Ankle dorsiflexion heel walking    Ankle plantarflexion toe walking    Ankle inversion    Ankle eversion     (Blank rows = not tested)  LUMBAR SPECIAL TESTS:  Straight leg raise test: Negative and Slump test: Negative  FUNCTIONAL TESTS:  Forward step up 8 reps each on 6 step : noted instability and increased valgum on R, normal and good control on L   GAIT: Distance walked: in clinic, wnl, normal cadence, no difference in stride length noted Assistive device utilized: None Level of assistance: Complete Independence   TREATMENT DATE: 02/10/24: Eval, abbreviated due to late start Education regarding need for light resistance training, also inst in side lying R hip abd and seated yellow t band B shoulder ER to initiate home program  PATIENT EDUCATION:  Education details: POC, goals Person educated: Patient Education method: Explanation, Demonstration, Tactile cues, Verbal cues, and Handouts Education comprehension: verbalized understanding  HOME EXERCISE PROGRAM: Access Code: [email or web address] URL: [email or web address] Date: 02/10/2024 Prepared by: Jairo Bellew  Exercises - Modified Sidelying Hip Abduction  - 1 x daily - 7 x weekly - 3 sets - 10 reps - Seated Bilateral Shoulder External  Rotation with Resistance at Wrists  - 1 x daily - 7 x weekly - 3 sets - 10 reps  ASSESSMENT:  CLINICAL IMPRESSION: Patient is a 68 y.o. female who was evaluated today by physical therapy due to lower back pain and additional diagnosis of spinal scoliosis which she reports is idiopathic. She presents with altered ROM for R lumbar side bending and weakness with R hip abduction specifically.  She is involved in a good fitness regimen already, attends Yoga classes 2-3 x week and walks several times a week. She should benefit from physical therapy with focus of treatment on education regarding a long term stretching and strengthening program , primarily for her posterior chain musculature, Ue's and LE's and periscapular.    OBJECTIVE IMPAIRMENTS: decreased activity tolerance, difficulty walking, decreased ROM, decreased strength, postural dysfunction, and pain.  ACTIVITY LIMITATIONS: lifting, sleeping, stairs, and locomotion level  PARTICIPATION LIMITATIONS: shopping, community activity, and yard work  PERSONAL FACTORS: Age, Past/current experiences, Time since onset of injury/illness/exacerbation, and 1 comorbidity: L THA are also affecting patient's functional outcome.   REHAB POTENTIAL: Good  CLINICAL DECISION MAKING: Evolving/moderate complexity  EVALUATION COMPLEXITY: Moderate   GOALS: Goals reviewed with patient? Yes  SHORT TERM GOALS: Target date: 2 weeks, 02/24/24  I HEP, Baseline:initiated today Goal status: INITIAL    LONG TERM GOALS: Target date: 8 weeks, 04/06/24  Modified oswestry 16% deficit Baseline:  Goal status: INITIAL  2.  Improve R hip abduction strength to 5/5 with MMT for improved tolerance to walking Baseline:  Goal status: INITIAL  3.  Forward step up 6 step, 8 reps, symmetrical and with normal motor control as compared to L Baseline:  Goal status: INITIAL  4.  Able to I manage long term ex program designed to address her posterior periscapular, post  hip trunk strength, utilizing equipment at Y and at home to adjunct her current home program Baseline:  Goal status: INITIAL  5.  Abe to walk over 30 min consistently without pain lower back Baseline:  Goal status: INITIAL   PLAN:  PT FREQUENCY: 1x/week  PT DURATION: 8 weeks  PLANNED INTERVENTIONS: 97110-Therapeutic exercises, 97530- Therapeutic activity, [id number]- Neuromuscular re-education, 97535- Self Care, [id number]- Manual therapy, and Patient/Family education.  PLAN FOR NEXT SESSION: utilize equipment in gym for post shoulder , post hip strength, periscapular, also inst in home routine, manual techniques as needed R post hip musculature, modalities as needed as well.   Rhemi Balbach L Lizandra Zakrzewski, PT, DPT, OCS 02/10/2024, 10:33 AM

## 2024-02-15 ENCOUNTER — Encounter: Payer: Self-pay | Admitting: Physical Therapy

## 2024-02-15 ENCOUNTER — Ambulatory Visit: Admitting: Physical Therapy

## 2024-02-15 DIAGNOSIS — M5459 Other low back pain: Secondary | ICD-10-CM

## 2024-02-15 DIAGNOSIS — R262 Difficulty in walking, not elsewhere classified: Secondary | ICD-10-CM | POA: Diagnosis not present

## 2024-02-15 DIAGNOSIS — R293 Abnormal posture: Secondary | ICD-10-CM

## 2024-02-15 DIAGNOSIS — M4125 Other idiopathic scoliosis, thoracolumbar region: Secondary | ICD-10-CM

## 2024-02-15 NOTE — Therapy (Signed)
 OUTPATIENT PHYSICAL THERAPY THORACOLUMBAR EVALUATION   Patient Name: Shirley Johnson MRN: [id number] DOB:November 29, 1955, 68 y.o., female Today's Date: 02/15/2024  END OF SESSION:  PT End of Session - 02/15/24 1010     Visit Number 2    Date for Recertification  04/06/24    PT Start Time 1015    PT Stop Time 1100    PT Time Calculation (min) 45 min    Activity Tolerance Patient tolerated treatment well    Behavior During Therapy WFL for tasks assessed/performed          Past Medical History:  Diagnosis Date   Allergy    pollen   Anxiety    Asthma    Depression    Hypertension    Thyroid  disease    Vitamin D  deficiency    Past Surgical History:  Procedure Laterality Date   ANTERIOR AND POSTERIOR REPAIR N/A 11/13/2013   Procedure: POSTERIOR REPAIR (RECTOCELE), ;  Surgeon: Rosaline LITTIE Cobble, MD;  Location: [facility];  Service: Gynecology;  Laterality: N/A;   BACK SURGERY     CATARACT EXTRACTION W/ INTRAOCULAR LENS IMPLANT     CHOLECYSTECTOMY     COLONOSCOPY  10/16/2010   Dr.Kaplan   HEMORRHOID SURGERY     banding   LAPAROSCOPIC ASSISTED VAGINAL HYSTERECTOMY N/A 11/13/2013   Procedure: LAPAROSCOPIC ASSISTED VAGINAL HYSTERECTOMY WITH BILATERAL SALPINGO OOPHORECTOMY;  Surgeon: Rosaline LITTIE Cobble, MD;  Location: [facility];  Service: Gynecology;  Laterality: N/A;   SIGMOIDOSCOPY     TOTAL HIP ARTHROPLASTY     TUBAL LIGATION     Patient Active Problem List   Diagnosis Date Noted   Hypothyroidism 01/10/2014   S/P laparoscopic assisted vaginal hysterectomy (LAVH) 11/13/2013   Mixed hyperlipidemia 05/30/2013   Other abnormal glucose 05/30/2013   Vitamin D  deficiency 05/30/2013   Allergy    Depression    Hypertension    Anxiety     PCP: Waylan Norris, MD  REFERRING PROVIDER: same  REFERRING DIAG: scoliosis  Rationale for Evaluation and Treatment: Rehabilitation  THERAPY DIAG:  Other low back pain  Difficulty in walking, not elsewhere classified  Other idiopathic  scoliosis, thoracolumbar region  Abnormal posture  ONSET DATE: chronic  SUBJECTIVE:                                                                                                                                                                                           SUBJECTIVE STATEMENT:  Feeling great     My lower back hurts primarily at night, I can only sleep on one side , painful to turn over.  The flexeril helps me sleep  Also hurts when I walk at the 30 min mark, on each side of my spine  PERTINENT HISTORY:  Referred by PCP, due to scoliosis and ho lower back pain.  Had L THA 3 yrs ago  PAIN:  Are you having pain? Yes: NPRS scale: 0/10 Pain location: across lower back more on R than L but both sides Pain description: deep ache, sometimes cramping feeling Aggravating factors: walking 30 min, sleeping, lifting wheelbarrow Relieving factors: flexiril  PRECAUTIONS: None  RED FLAGS: None   WEIGHT BEARING RESTRICTIONS: No  FALLS:  Has patient fallen in last 6 months? No  LIVING ENVIRONMENT: Lives with: lives with their family Lives in: House/apartment Stairs: steps to basement uses occasionally Has following equipment at home: Single point cane  OCCUPATION: retired, cares for grand kids, goes to yoga and will start [place] [employer], walks several times a week  PLOF: Independent  PATIENT GOALS: be able to walk , sleep with less lower back pain  NEXT MD VISIT: in 3 months  OBJECTIVE:  Note: Objective measures were completed at Evaluation unless otherwise noted.  DIAGNOSTIC FINDINGS:  na  PATIENT SURVEYS:  Modified Oswestry Low Back Pain Disability Questionnaire: 8 / 50 = 16.0 %  COGNITION: Overall cognitive status: Within functional limits for tasks assessed     SENSATION: WFL  MUSCLE LENGTH: Hamstrings: Right wnl deg; Left wnl deg Debby test: Right wnl deg; Left wnl deg  POSTURE: R rib hump mild with forward flexion.  Lumbar region with L concavity, R  convexity thoracic spine, mild   PALPATION: Mild tenderness R gluteus medius  LUMBAR ROM:   AROM eval  Flexion wnl  Extension wnl  Right lateral flexion 60% with p!  Left lateral flexion 70% no pain  Right rotation   Left rotation    (Blank rows = not tested)  LOWER EXTREMITY ROM:   all WNL  LOWER EXTREMITY MMT:    MMT Right eval Left eval  Hip flexion wnl wnl  Hip extension lock bridge wnl wnl  Hip abduction 4/5 5/5  Hip adduction    Hip internal rotation    Hip external rotation wnl wnl  Knee flexion    Knee extension    Ankle dorsiflexion heel walking    Ankle plantarflexion toe walking    Ankle inversion    Ankle eversion     (Blank rows = not tested)  LUMBAR SPECIAL TESTS:  Straight leg raise test: Negative and Slump test: Negative  FUNCTIONAL TESTS:  Forward step up 8 reps each on 6 step : noted instability and increased valgum on R, normal and good control on L   GAIT: Distance walked: in clinic, wnl, normal cadence, no difference in stride length noted Assistive device utilized: None Level of assistance: Complete Independence   TREATMENT DATE:  02/15/24 NuStep L 5 x 6 min Seated Rows 2x10 20lb & Lats 20lb 2x10 , 25lb x 10  Sit to stand OHP w/ yellow wball 2x10  Bridges  LE on pball bridges, oblq, K2X 2x10 Passive stretching to bilateral hamstrings, piriformis, K2C, Glute, lower trunk rotations     02/10/24: Eval, abbreviated due to late start Education regarding need for light resistance training, also inst in side lying R hip abd and seated yellow t band B shoulder ER to initiate home program  PATIENT EDUCATION:  Education details: POC, goals Person educated: Patient Education method: Explanation, Demonstration, Tactile cues, Verbal cues, and Handouts Education comprehension: verbalized understanding  HOME  EXERCISE PROGRAM: Access Code: [email or web address] URL: [email or web address] Date: 02/10/2024 Prepared by: Amy Speaks  Exercises - Modified Sidelying Hip Abduction  - 1 x daily - 7 x weekly - 3 sets - 10 reps - Seated Bilateral Shoulder External Rotation with Resistance at Wrists  - 1 x daily - 7 x weekly - 3 sets - 10 reps  ASSESSMENT:  CLINICAL IMPRESSION: Patient is a 68 y.o. female who was seen today by physical therapy due to lower back pain and additional diagnosis of spinal scoliosis which she reports is idiopathic.  She is involved in a good fitness regimen already, attends Yoga classes 2-3 x week and walks several times a week. She enters doing well. Pt tolerated an progression to postural strengthening well with the use of machine level interventions.  Postural cues needed with seated rows, and standing shoulder extensions. Some tightness in both piriformis muscles.   She should benefit from physical therapy with focus of treatment on education regarding a long term stretching and strengthening program , primarily for her posterior chain musculature, Ue's and LE's and periscapular.    OBJECTIVE IMPAIRMENTS: decreased activity tolerance, difficulty walking, decreased ROM, decreased strength, postural dysfunction, and pain.  ACTIVITY LIMITATIONS: lifting, sleeping, stairs, and locomotion level  PARTICIPATION LIMITATIONS: shopping, community activity, and yard work  PERSONAL FACTORS: Age, Past/current experiences, Time since onset of injury/illness/exacerbation, and 1 comorbidity: L THA are also affecting patient's functional outcome.   REHAB POTENTIAL: Good  CLINICAL DECISION MAKING: Evolving/moderate complexity  EVALUATION COMPLEXITY: Moderate   GOALS: Goals reviewed with patient? Yes  SHORT TERM GOALS: Target date: 2 weeks, 02/24/24  I HEP, Baseline:initiated today Goal status: Met 02/15/24    LONG TERM GOALS: Target date: 8 weeks, 04/06/24  Modified oswestry  16% deficit Baseline:  Goal status: INITIAL  2.  Improve R hip abduction strength to 5/5 with MMT for improved tolerance to walking Baseline:  Goal status: INITIAL  3.  Forward step up 6 step, 8 reps, symmetrical and with normal motor control as compared to L Baseline:  Goal status: INITIAL  4.  Able to I manage long term ex program designed to address her posterior periscapular, post hip trunk strength, utilizing equipment at [facility] and at home to adjunct her current home program Baseline:  Goal status: INITIAL  5.  Abe to walk over 30 min consistently without pain lower back Baseline:  Goal status: INITIAL   PLAN:  PT FREQUENCY: 1x/week  PT DURATION: 8 weeks  PLANNED INTERVENTIONS: 97110-Therapeutic exercises, 97530- Therapeutic activity, [id number]- Neuromuscular re-education, 97535- Self Care, [id number]- Manual therapy, and Patient/Family education.  PLAN FOR NEXT SESSION: utilize equipment in gym for post shoulder , post hip strength, periscapular, also inst in home routine, manual techniques as needed R post hip musculature, modalities as needed as well.   Tanda KANDICE Sorrow, PTA 02/15/2024, 10:11 AM

## 2024-02-21 ENCOUNTER — Encounter: Payer: Self-pay | Admitting: Physical Therapy

## 2024-02-21 ENCOUNTER — Ambulatory Visit: Admitting: Physical Therapy

## 2024-02-21 DIAGNOSIS — R262 Difficulty in walking, not elsewhere classified: Secondary | ICD-10-CM

## 2024-02-21 DIAGNOSIS — M5459 Other low back pain: Secondary | ICD-10-CM

## 2024-02-21 DIAGNOSIS — R293 Abnormal posture: Secondary | ICD-10-CM

## 2024-02-21 DIAGNOSIS — M4125 Other idiopathic scoliosis, thoracolumbar region: Secondary | ICD-10-CM

## 2024-02-21 NOTE — Therapy (Signed)
 OUTPATIENT PHYSICAL THERAPY THORACOLUMBAR TREATMENT   Patient Name: Shirley Johnson MRN: [id number] DOB:[birth year], 68 y.o., female Today's Date: 02/21/2024  END OF SESSION:  PT End of Session - 02/21/24 0757     Visit Number 3    Date for Recertification  04/06/24    PT Start Time 0759    PT Stop Time 0844    PT Time Calculation (min) 45 min    Activity Tolerance Patient tolerated treatment well    Behavior During Therapy [facility] for tasks assessed/performed          Past Medical History:  Diagnosis Date   Allergy    pollen   Anxiety    Asthma    Depression    Hypertension    Thyroid  disease    Vitamin D  deficiency    Past Surgical History:  Procedure Laterality Date   ANTERIOR AND POSTERIOR REPAIR N/A 11/13/2013   Procedure: POSTERIOR REPAIR (RECTOCELE), ;  Surgeon: Rosaline LITTIE Cobble, MD;  Location: [facility];  Service: Gynecology;  Laterality: N/A;   BACK SURGERY     CATARACT EXTRACTION W/ INTRAOCULAR LENS IMPLANT     CHOLECYSTECTOMY     COLONOSCOPY  10/16/2010   Dr.Kaplan   HEMORRHOID SURGERY     banding   LAPAROSCOPIC ASSISTED VAGINAL HYSTERECTOMY N/A 11/13/2013   Procedure: LAPAROSCOPIC ASSISTED VAGINAL HYSTERECTOMY WITH BILATERAL SALPINGO OOPHORECTOMY;  Surgeon: Rosaline LITTIE Cobble, MD;  Location: [facility];  Service: Gynecology;  Laterality: N/A;   SIGMOIDOSCOPY     TOTAL HIP ARTHROPLASTY     TUBAL LIGATION     Patient Active Problem List   Diagnosis Date Noted   Hypothyroidism 01/10/2014   S/P laparoscopic assisted vaginal hysterectomy (LAVH) 11/13/2013   Mixed hyperlipidemia 05/30/2013   Other abnormal glucose 05/30/2013   Vitamin D  deficiency 05/30/2013   Allergy    Depression    Hypertension    Anxiety     PCP: Waylan Norris, MD  REFERRING PROVIDER: same  REFERRING DIAG: scoliosis  Rationale for Evaluation and Treatment: Rehabilitation  THERAPY DIAG:  Other low back pain  Difficulty in walking, not elsewhere classified  Other idiopathic  scoliosis, thoracolumbar region  Abnormal posture  ONSET DATE: chronic  SUBJECTIVE:                                                                                                                                                                                           SUBJECTIVE STATEMENT:  I'm doing great     My lower back hurts primarily at night, I can only sleep on one side , painful to turn over.  The flexeril helps me  sleep Also hurts when I walk at the 30 min mark, on each side of my spine  PERTINENT HISTORY:  Referred by PCP, due to scoliosis and ho lower back pain.  Had L THA 3 yrs ago  PAIN:  Are you having pain? Yes: NPRS scale: 0/10 Pain location: across lower back more on R than L but both sides Pain description: deep ache, sometimes cramping feeling Aggravating factors: walking 30 min, sleeping, lifting wheelbarrow Relieving factors: flexiril  PRECAUTIONS: None  RED FLAGS: None   WEIGHT BEARING RESTRICTIONS: No  FALLS:  Has patient fallen in last 6 months? No  LIVING ENVIRONMENT: Lives with: lives with their family Lives in: House/apartment Stairs: steps to basement uses occasionally Has following equipment at home: Single point cane  OCCUPATION: retired, cares for grand kids, goes to yoga and will start [place] [employer], walks several times a week  PLOF: Independent  PATIENT GOALS: be able to walk , sleep with less lower back pain  NEXT MD VISIT: in 3 months  OBJECTIVE:  Note: Objective measures were completed at Evaluation unless otherwise noted.  DIAGNOSTIC FINDINGS:  na  PATIENT SURVEYS:  Modified Oswestry Low Back Pain Disability Questionnaire: 8 / 50 = 16.0 %  COGNITION: Overall cognitive status: Within functional limits for tasks assessed     SENSATION: WFL  MUSCLE LENGTH: Hamstrings: Right wnl deg; Left wnl deg Debby test: Right wnl deg; Left wnl deg  POSTURE: R rib hump mild with forward flexion.  Lumbar region with L concavity, R  convexity thoracic spine, mild   PALPATION: Mild tenderness R gluteus medius  LUMBAR ROM:   AROM eval 02/21/24  Flexion wnl WFL  Extension wnl WFL  Right lateral flexion 60% with p! Limited 25% p!  Left lateral flexion 70% no pain Limited 25%  Right rotation    Left rotation     (Blank rows = not tested)  LOWER EXTREMITY ROM:   all WNL  LOWER EXTREMITY MMT:    MMT Right eval Left eval  Hip flexion wnl wnl  Hip extension lock bridge wnl wnl  Hip abduction 4/5 5/5  Hip adduction    Hip internal rotation    Hip external rotation wnl wnl  Knee flexion    Knee extension    Ankle dorsiflexion heel walking    Ankle plantarflexion toe walking    Ankle inversion    Ankle eversion     (Blank rows = not tested)  LUMBAR SPECIAL TESTS:  Straight leg raise test: Negative and Slump test: Negative  FUNCTIONAL TESTS:  Forward step up 8 reps each on 6 step : noted instability and increased valgum on R, normal and good control on L   GAIT: Distance walked: in clinic, wnl, normal cadence, no difference in stride length noted Assistive device utilized: None Level of assistance: Complete Independence   TREATMENT DATE:  02/21/24 NuStep L 5 x 6 min Shoulder Ext 10lb 2x10 Standing rows 10lb 2x10 15lb AR press x10 each  Hip Ext 10lb 2x10 each Hip abduction 5/5 for both 6in step ups x10 each LE on pball bridges, oblq, K2C 2x10 Passive stretching to bilateral hamstrings, piriformis, K2C, Glute, lower trunk rotations  02/15/24 NuStep L 5 x 6 min Seated Rows 2x10 20lb & Lats 20lb 2x10 , 25lb x 10  Sit to stand OHP w/ yellow wball 2x10  Bridges  LE on pball bridges, oblq, K2X 2x10 Passive stretching to bilateral hamstrings, piriformis, K2C, Glute, lower trunk rotations  02/10/24: Eval, abbreviated due to late start Education regarding need for light resistance training, also inst in side lying R hip abd and seated yellow t band B shoulder ER to initiate home program                                                                                                                                   PATIENT EDUCATION:  Education details: POC, goals Person educated: Patient Education method: Explanation, Demonstration, Tactile cues, Verbal cues, and Handouts Education comprehension: verbalized understanding  HOME EXERCISE PROGRAM: Access Code: [email or web address] URL: [email or web address] Date: 02/10/2024 Prepared by: Amy Speaks  Exercises - Modified Sidelying Hip Abduction  - 1 x daily - 7 x weekly - 3 sets - 10 reps - Seated Bilateral Shoulder External Rotation with Resistance at Wrists  - 1 x daily - 7 x weekly - 3 sets - 10 reps  ASSESSMENT:  CLINICAL IMPRESSION: Patient is a 68 y.o. female who was seen today by physical therapy due to lower back pain and additional diagnosis of spinal scoliosis which she reports is idiopathic.  She is involved in a good fitness regimen already, attends Yoga classes 2-3 x week and walks several times a week. Again she enters doing well. Pt has progressed towards goals. Continued with postural strengthening well with the use of machine level interventions.  Postural cues needed with shoulder and hip extensions.Tightness in both piriformis muscles remains.  Some weakness with anti rotational presses.   She should benefit from physical therapy with focus of treatment on education regarding a long term stretching and strengthening program , primarily for her posterior chain musculature, Ue's and LE's and periscapular.    OBJECTIVE IMPAIRMENTS: decreased activity tolerance, difficulty walking, decreased ROM, decreased strength, postural dysfunction, and pain.  ACTIVITY LIMITATIONS: lifting, sleeping, stairs, and locomotion level  PARTICIPATION LIMITATIONS: shopping, community activity, and yard work  PERSONAL FACTORS: Age, Past/current experiences, Time since onset of injury/illness/exacerbation, and 1 comorbidity: L THA  are also affecting patient's functional outcome.   REHAB POTENTIAL: Good  CLINICAL DECISION MAKING: Evolving/moderate complexity  EVALUATION COMPLEXITY: Moderate   GOALS: Goals reviewed with patient? Yes  SHORT TERM GOALS: Target date: 2 weeks, 02/24/24  I HEP, Baseline:initiated today Goal status: Met 02/15/24    LONG TERM GOALS: Target date: 8 weeks, 04/06/24  Modified oswestry 16% deficit Baseline:  Goal status: INITIAL  2.  Improve R hip abduction strength to 5/5 with MMT for improved tolerance to walking Baseline:  Goal status: Met 02/21/24  3.  Forward step up 6 step, 8 reps, symmetrical and with normal motor control as compared to L Baseline:  Goal status: INITIAL  4.  Able to I manage long term ex program designed to address her posterior periscapular, post hip trunk strength, [facility] and at home to adjunct her current home program Baseline:  Goal status: INITIAL  5.  Abe to walk over 30 min  consistently without pain lower back Baseline:  Goal status: Progessing can on a Tmill 02/21/24   PLAN:  PT FREQUENCY: 1x/week  PT DURATION: 8 weeks  PLANNED INTERVENTIONS: 97110-Therapeutic exercises, 97530- Therapeutic activity, [id number]- Neuromuscular re-education, 97535- Self Care, 02859- Manual therapy, and Patient/Family education.  PLAN FOR NEXT SESSION: utilize equipment in gym for post shoulder , post hip strength, periscapular, also inst in home routine, manual techniques as needed R post hip musculature, modalities as needed as well.   Tanda KANDICE Sorrow, PTA 02/21/2024, 7:57 AM

## 2024-02-22 ENCOUNTER — Ambulatory Visit: Admitting: Physical Therapy

## 2024-02-29 ENCOUNTER — Ambulatory Visit

## 2024-03-02 ENCOUNTER — Encounter: Payer: Self-pay | Admitting: Physical Therapy

## 2024-03-02 ENCOUNTER — Ambulatory Visit: Admitting: Physical Therapy

## 2024-03-02 DIAGNOSIS — R262 Difficulty in walking, not elsewhere classified: Secondary | ICD-10-CM | POA: Diagnosis not present

## 2024-03-02 DIAGNOSIS — R293 Abnormal posture: Secondary | ICD-10-CM

## 2024-03-02 DIAGNOSIS — M5459 Other low back pain: Secondary | ICD-10-CM

## 2024-03-02 DIAGNOSIS — M4125 Other idiopathic scoliosis, thoracolumbar region: Secondary | ICD-10-CM

## 2024-03-02 NOTE — Therapy (Signed)
 OUTPATIENT PHYSICAL THERAPY THORACOLUMBAR TREATMENT   Patient Name: Shirley Johnson MRN: [id number] DOB:1955/11/19, 68 y.o., female Today's Date: 03/02/2024  END OF SESSION:  PT End of Session - 03/02/24 0755     Visit Number 4    Date for Recertification  04/06/24    PT Start Time 0755    PT Stop Time 0840    PT Time Calculation (min) 45 min    Activity Tolerance Patient tolerated treatment well    Behavior During Therapy [facility] for tasks assessed/performed          Past Medical History:  Diagnosis Date   Allergy    pollen   Anxiety    Asthma    Depression    Hypertension    Thyroid  disease    Vitamin D  deficiency    Past Surgical History:  Procedure Laterality Date   ANTERIOR AND POSTERIOR REPAIR N/A 11/13/2013   Procedure: POSTERIOR REPAIR (RECTOCELE), ;  Surgeon: Rosaline LITTIE Cobble, MD;  Location: [facility];  Service: Gynecology;  Laterality: N/A;   BACK SURGERY     CATARACT EXTRACTION W/ INTRAOCULAR LENS IMPLANT     CHOLECYSTECTOMY     COLONOSCOPY  10/16/2010   Dr.Kaplan   HEMORRHOID SURGERY     banding   LAPAROSCOPIC ASSISTED VAGINAL HYSTERECTOMY N/A 11/13/2013   Procedure: LAPAROSCOPIC ASSISTED VAGINAL HYSTERECTOMY WITH BILATERAL SALPINGO OOPHORECTOMY;  Surgeon: Rosaline LITTIE Cobble, MD;  Location: [facility];  Service: Gynecology;  Laterality: N/A;   SIGMOIDOSCOPY     TOTAL HIP ARTHROPLASTY     TUBAL LIGATION     Patient Active Problem List   Diagnosis Date Noted   Hypothyroidism 01/10/2014   S/P laparoscopic assisted vaginal hysterectomy (LAVH) 11/13/2013   Mixed hyperlipidemia 05/30/2013   Other abnormal glucose 05/30/2013   Vitamin D  deficiency 05/30/2013   Allergy    Depression    Hypertension    Anxiety     PCP: Waylan Norris, MD  REFERRING PROVIDER: same  REFERRING DIAG: scoliosis  Rationale for Evaluation and Treatment: Rehabilitation  THERAPY DIAG:  Other low back pain  Difficulty in walking, not elsewhere classified  Other idiopathic  scoliosis, thoracolumbar region  Abnormal posture  ONSET DATE: chronic  SUBJECTIVE:                                                                                                                                                                                           SUBJECTIVE STATEMENT: Great   My lower back hurts primarily at night, I can only sleep on one side , painful to turn over.  The flexeril helps me sleep Also hurts when I  walk at the 30 min mark, on each side of my spine  PERTINENT HISTORY:  Referred by PCP, due to scoliosis and ho lower back pain.  Had L THA 3 yrs ago  PAIN:  Are you having pain? Yes: NPRS scale: 0/10 Pain location: across lower back more on R than L but both sides Pain description: deep ache, sometimes cramping feeling Aggravating factors: walking 30 min, sleeping, lifting wheelbarrow Relieving factors: flexiril  PRECAUTIONS: None  RED FLAGS: None   WEIGHT BEARING RESTRICTIONS: No  FALLS:  Has patient fallen in last 6 months? No  LIVING ENVIRONMENT: Lives with: lives with their family Lives in: House/apartment Stairs: steps to basement uses occasionally Has following equipment at home: Single point cane  OCCUPATION: retired, cares for grand kids, goes to yoga and will start [place] [employer], walks several times a week  PLOF: Independent  PATIENT GOALS: be able to walk , sleep with less lower back pain  NEXT MD VISIT: in 3 months  OBJECTIVE:  Note: Objective measures were completed at Evaluation unless otherwise noted.  DIAGNOSTIC FINDINGS:  na  PATIENT SURVEYS:  Modified Oswestry Low Back Pain Disability Questionnaire: 8 / 50 = 16.0 %  COGNITION: Overall cognitive status: Within functional limits for tasks assessed     SENSATION: WFL  MUSCLE LENGTH: Hamstrings: Right wnl deg; Left wnl deg Debby test: Right wnl deg; Left wnl deg  POSTURE: R rib hump mild with forward flexion.  Lumbar region with L concavity, R convexity  thoracic spine, mild   PALPATION: Mild tenderness R gluteus medius  LUMBAR ROM:   AROM eval 02/21/24  Flexion wnl WFL  Extension wnl WFL  Right lateral flexion 60% with p! Limited 25% p!  Left lateral flexion 70% no pain Limited 25%  Right rotation    Left rotation     (Blank rows = not tested)  LOWER EXTREMITY ROM:   all WNL  LOWER EXTREMITY MMT:    MMT Right eval Left eval  Hip flexion wnl wnl  Hip extension lock bridge wnl wnl  Hip abduction 4/5 5/5  Hip adduction    Hip internal rotation    Hip external rotation wnl wnl  Knee flexion    Knee extension    Ankle dorsiflexion heel walking    Ankle plantarflexion toe walking    Ankle inversion    Ankle eversion     (Blank rows = not tested)  LUMBAR SPECIAL TESTS:  Straight leg raise test: Negative and Slump test: Negative  FUNCTIONAL TESTS:  Forward step up 8 reps each on 6 step : noted instability and increased valgum on R, normal and good control on L   GAIT: Distance walked: in clinic, wnl, normal cadence, no difference in stride length noted Assistive device utilized: None Level of assistance: Complete Independence   TREATMENT DATE:  03/02/24 NuSteps L5 x 6 min Modified oswestry 3 / 50 = 6.0 % 6 & 8 inch step ups x10 each Seated Rows & Lats 25lb 2x10 Leg press 40lb 2x10 AR press 15lb x10 each Shoulder Ext 10lb 2x10  S2S OHP blue ball 2x10  02/21/24 NuStep L 5 x 6 min Shoulder Ext 10lb 2x10 Standing rows 10lb 2x10 15lb AR press x10 each  Hip Ext 10lb 2x10 each Hip abduction 5/5 for both 6in step ups x10 each LE on pball bridges, oblq, K2C 2x10 Passive stretching to bilateral hamstrings, piriformis, K2C, Glute, lower trunk rotations  02/15/24 NuStep L 5 x 6 min Seated Rows 2x10  20lb & Lats 20lb 2x10 , 25lb x 10  Sit to stand OHP w/ yellow wball 2x10  Bridges  LE on pball bridges, oblq, K2X 2x10 Passive stretching to bilateral hamstrings, piriformis, K2C, Glute, lower trunk rotations      02/10/24: Eval, abbreviated due to late start Education regarding need for light resistance training, also inst in side lying R hip abd and seated yellow t band B shoulder ER to initiate home program                                                                                                                                  PATIENT EDUCATION:  Education details: POC, goals Person educated: Patient Education method: Explanation, Demonstration, Tactile cues, Verbal cues, and Handouts Education comprehension: verbalized understanding  HOME EXERCISE PROGRAM: Access Code: [email or web address] URL: [email or web address] Date: 02/10/2024 Prepared by: Amy Speaks  Exercises - Modified Sidelying Hip Abduction  - 1 x daily - 7 x weekly - 3 sets - 10 reps - Seated Bilateral Shoulder External Rotation with Resistance at Wrists  - 1 x daily - 7 x weekly - 3 sets - 10 reps  ASSESSMENT:  CLINICAL IMPRESSION: Patient is a 68 y.o. female who was seen today by physical therapy due to lower back pain and additional diagnosis of spinal scoliosis which she reports is idiopathic.  She has progressed meeting all goals. [facility]  Some core weakness today with anti-rotational presses. She reports no functional limitations. All interventions completed well.  OBJECTIVE IMPAIRMENTS: decreased activity tolerance, difficulty walking, decreased ROM, decreased strength, postural dysfunction, and pain.  ACTIVITY LIMITATIONS: lifting, sleeping, stairs, and locomotion level  PARTICIPATION LIMITATIONS: shopping, community activity, and yard work  PERSONAL FACTORS: Age, Past/current experiences, Time since onset of injury/illness/exacerbation, and 1 comorbidity: L THA are also affecting patient's functional outcome.   REHAB POTENTIAL: Good  CLINICAL DECISION MAKING: Evolving/moderate complexity  EVALUATION COMPLEXITY: Moderate   GOALS: Goals reviewed with patient?  Yes  SHORT TERM GOALS: Target date: 2 weeks, 02/24/24  I HEP, Baseline:initiated today Goal status: Met 02/15/24    LONG TERM GOALS: Target date: 8 weeks, 04/06/24  Modified oswestry 16% deficit Baseline:  Goal status:  3 / 50 = 6.0 % 03/02/24 Met  2.  Improve R hip abduction strength to 5/5 with MMT for improved tolerance to walking Baseline:  Goal status: Met 02/21/24  3.  Forward step up 6 step, 8 reps, symmetrical and with normal motor control as compared to L Baseline:  Goal status: Met 03/02/24  4.  Able to I manage long term ex program designed to address her posterior periscapular, post hip trunk strength, utilizing [facility] and at [facility] to adjunct her current home program Baseline:  Goal status: Met 03/02/24  5.  Abe to walk over 30 min consistently without pain lower back Baseline:  Goal status: Progessing can on a Tmill  02/21/24, Met 03/02/24   PLAN:  PT FREQUENCY: 1x/week  PT DURATION: 8 weeks  PLANNED INTERVENTIONS: 97110-Therapeutic exercises, 97530- Therapeutic activity, 97112- Neuromuscular re-education, 97535- Self Care, 02859- Manual therapy, and Patient/Family education.  PLAN FOR NEXT SESSION: D/C PT  PHYSICAL THERAPY DISCHARGE SUMMARY  Visits from Start of Care: 4  Patient agrees to discharge. Patient goals were met. Patient is being discharged due to meeting the stated rehab goals.    Tanda KANDICE Sorrow, PTA 03/02/2024, 7:56 AM

## 2024-03-07 ENCOUNTER — Ambulatory Visit: Admitting: Physical Therapy
